# Patient Record
Sex: Male | Born: 1985 | Race: Black or African American | Hispanic: No | Marital: Single | Smoking: Current every day smoker
Health system: Southern US, Community
[De-identification: ages and names within clinical notes are randomized; demographics above are authoritative.]

---
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
## 2009-07-03 ENCOUNTER — Emergency Department: Payer: Self-pay | Admitting: Emergency Medicine

## 2009-10-04 ENCOUNTER — Emergency Department: Payer: Self-pay | Admitting: Unknown Physician Specialty

## 2009-12-03 ENCOUNTER — Emergency Department: Payer: Self-pay | Admitting: Emergency Medicine

## 2009-12-04 ENCOUNTER — Emergency Department: Payer: Self-pay | Admitting: Emergency Medicine

## 2009-12-14 ENCOUNTER — Emergency Department: Payer: Self-pay

## 2010-10-08 ENCOUNTER — Emergency Department: Payer: Self-pay | Admitting: Internal Medicine

## 2011-01-11 ENCOUNTER — Emergency Department: Payer: Self-pay | Admitting: Emergency Medicine

## 2011-01-15 ENCOUNTER — Emergency Department (HOSPITAL_COMMUNITY)
Admission: EM | Admit: 2011-01-15 | Discharge: 2011-01-15 | Discharge status: Against Medical Advice | Payer: Self-pay | Attending: Emergency Medicine | Admitting: Emergency Medicine

## 2011-01-15 DIAGNOSIS — Z0389 Encounter for observation for other suspected diseases and conditions ruled out: Secondary | ICD-10-CM | POA: Insufficient documentation

## 2011-06-06 IMAGING — CR RIGHT RING FINGER 2+V
1 series · 3 of 3 positions shown · non-contrast
Comparison: none

REASON FOR EXAM: injury possible dislocation
COMMENTS:

[Series 1: view not recorded · 0.17mm/px · 3 of 3 slices shown]
[im 1/3]
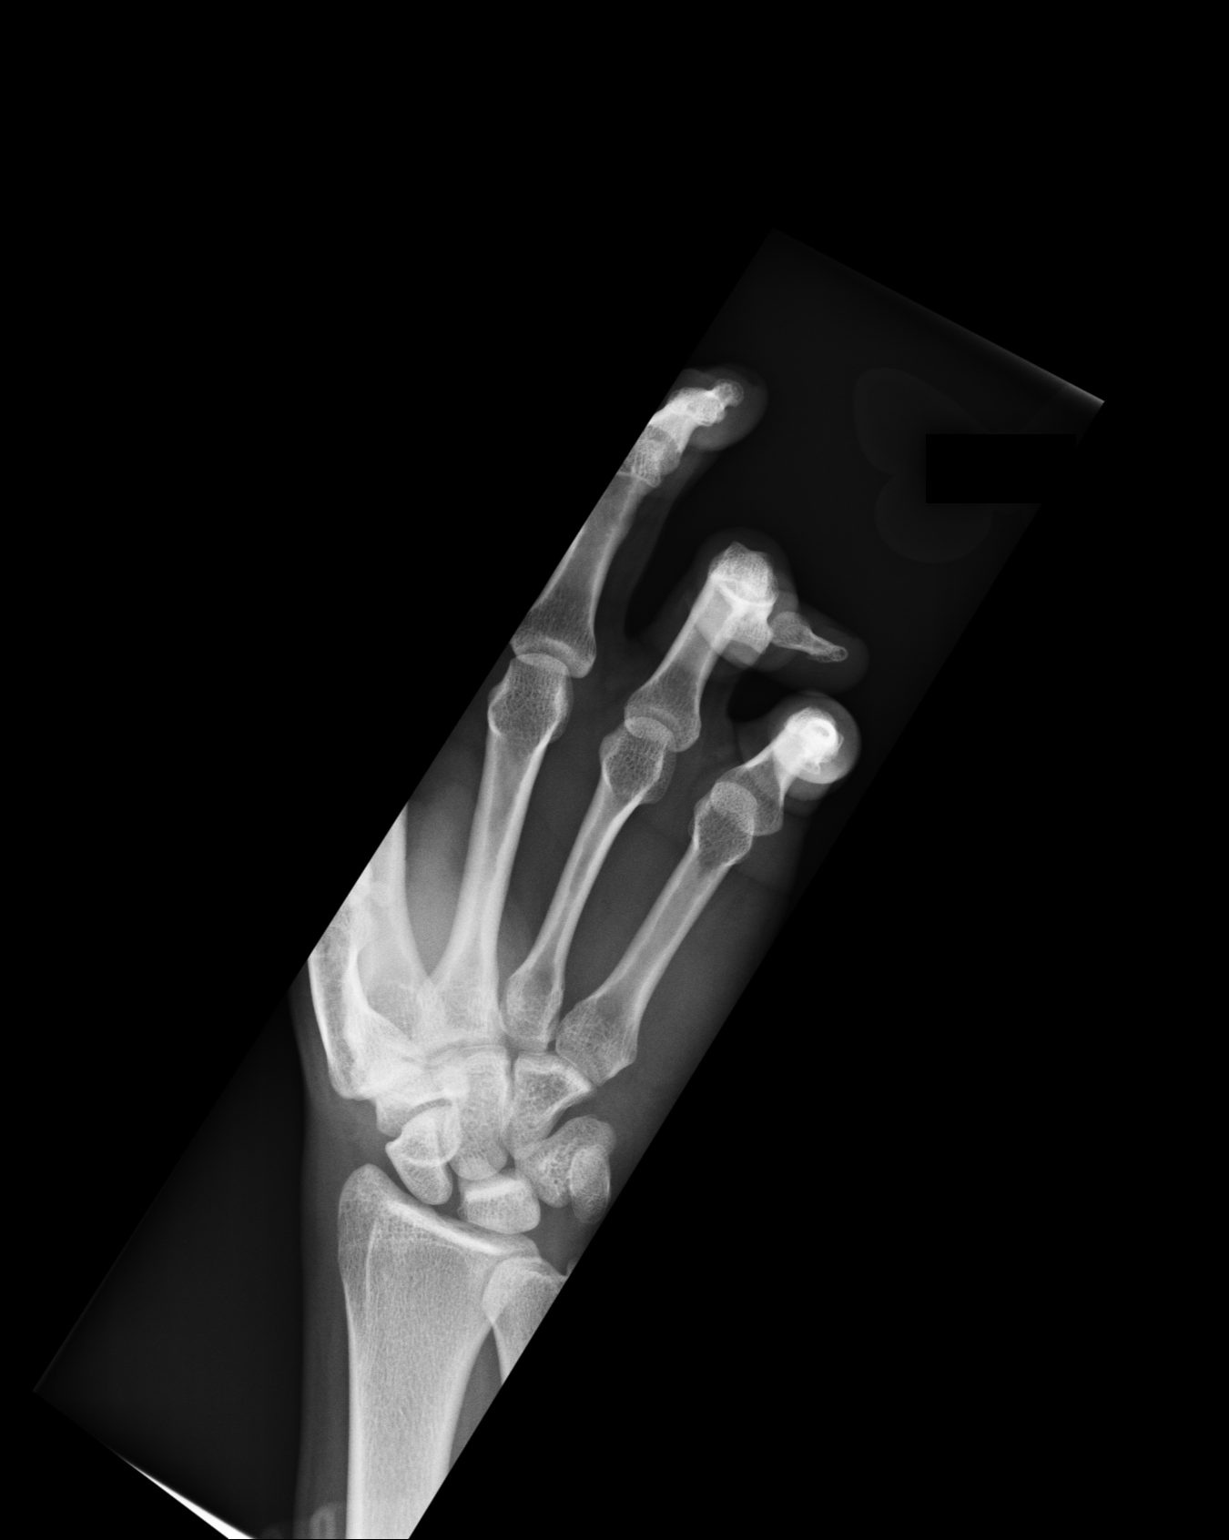
[im 2/3]
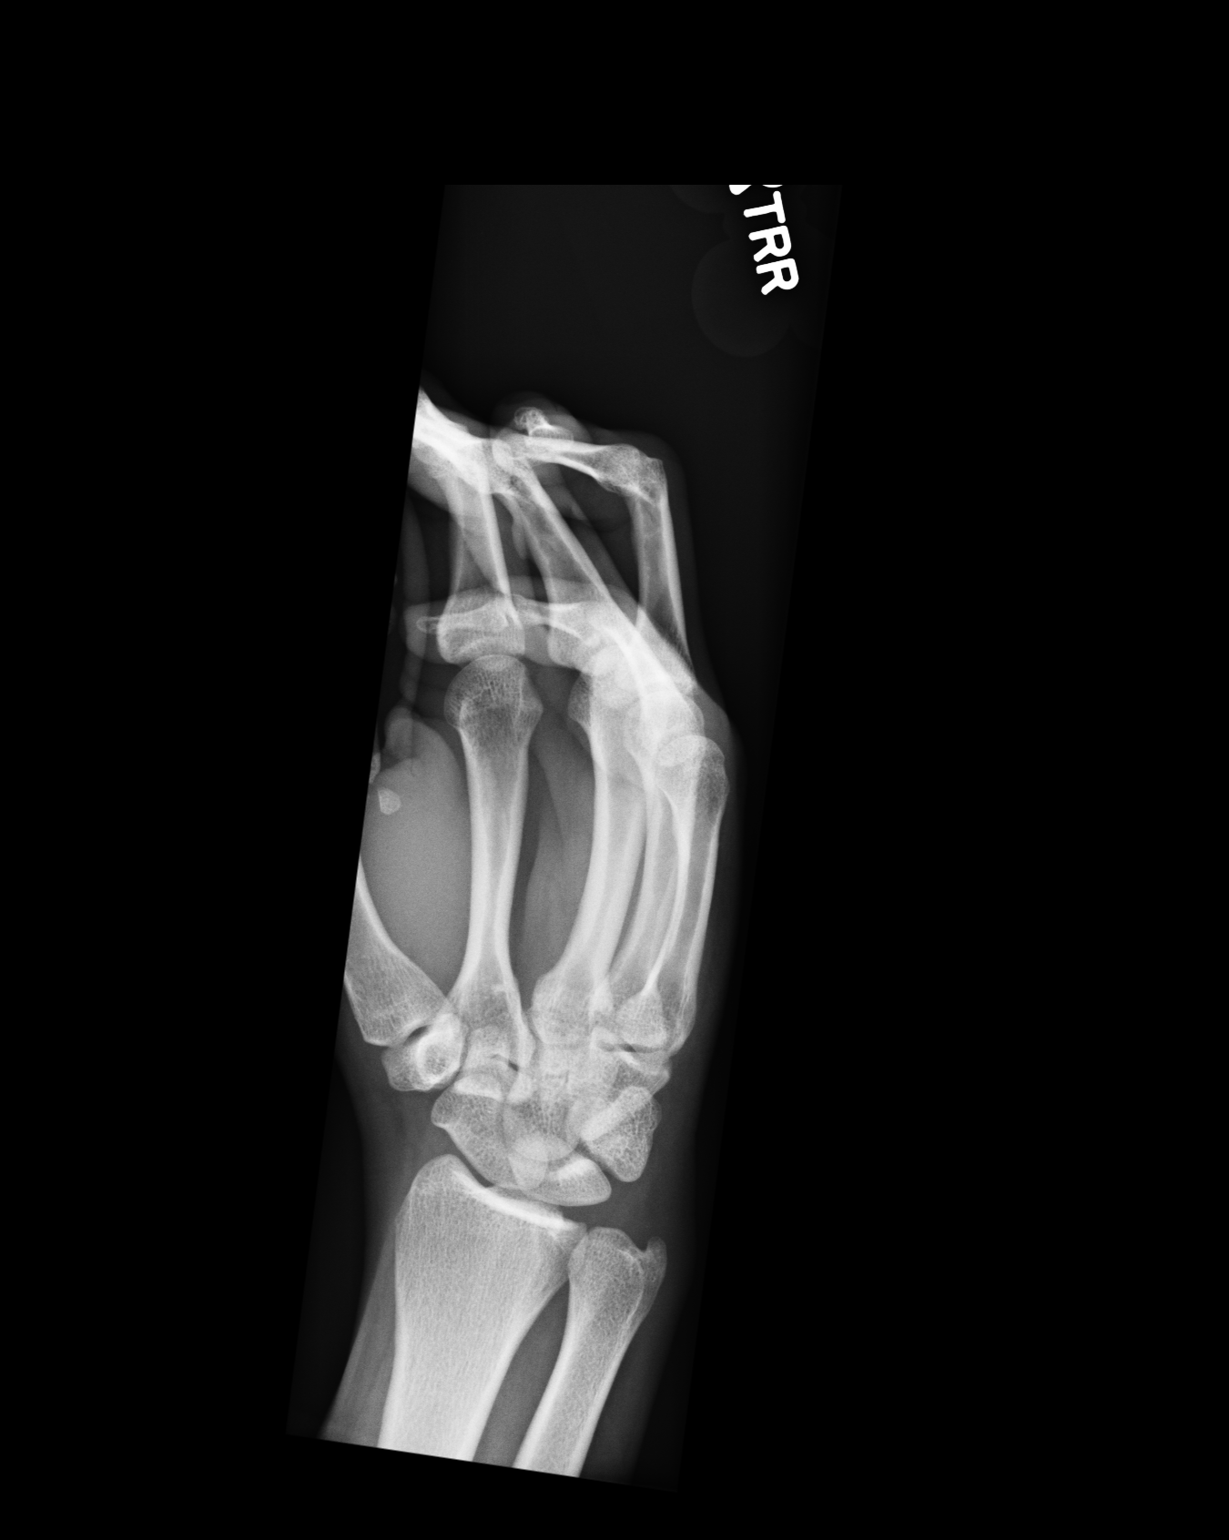
[im 3/3]
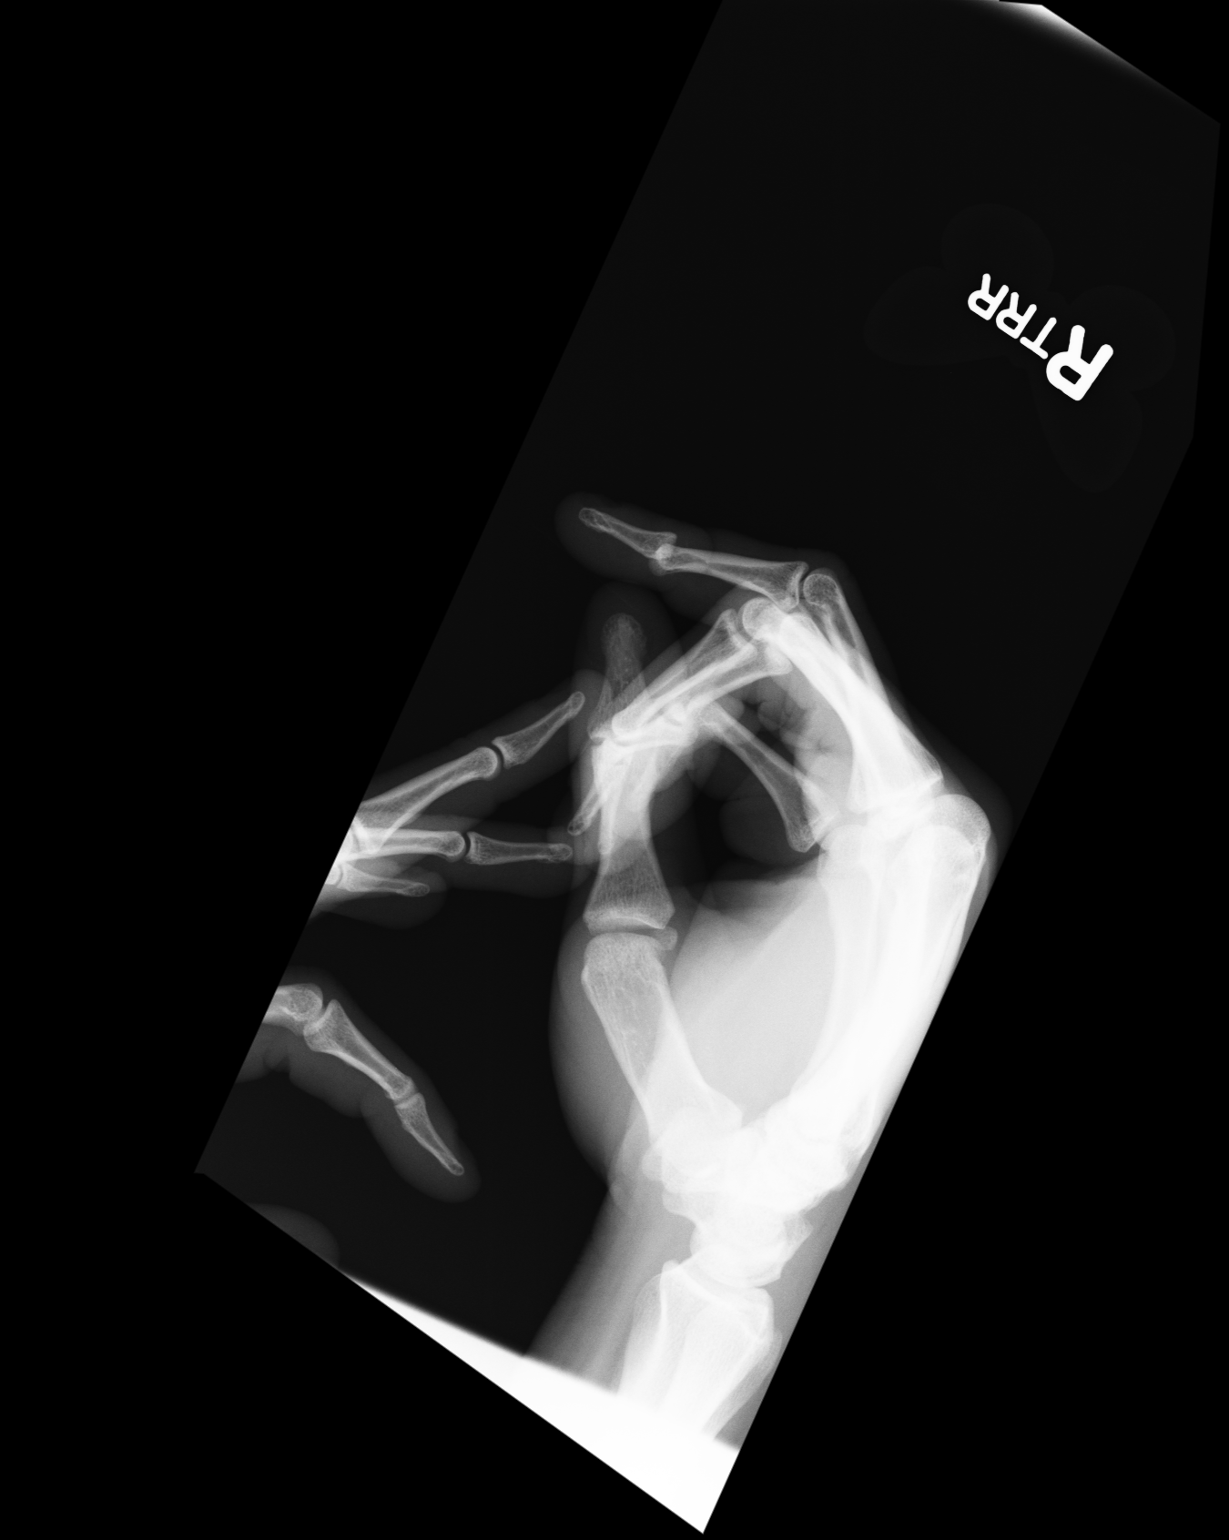

[3 of 3 positions shown; findings below may reference images not displayed]

PROCEDURE:     DXR - DXR FINGER RING 4TH DIGIT RT BETANCOURTH  - October 05, 2009 [DATE]

RESULT:     Images of the right fourth digit demonstrate what appears to be
dislocation at the distal interphalangeal joint in the fourth digit of the
right hand. There appears to be medial and dorsal displacement. It is
difficult to exclude a tiny fracture but no definite fracture is
demonstrated.
IMPRESSION: Please see above.

## 2012-03-10 ENCOUNTER — Emergency Department: Payer: Self-pay | Admitting: *Deleted

## 2012-03-10 LAB — BASIC METABOLIC PANEL
Anion Gap: 4 — ABNORMAL LOW (ref 7–16)
BUN: 20 mg/dL — ABNORMAL HIGH (ref 7–18)
Chloride: 105 mmol/L (ref 98–107)
Co2: 31 mmol/L (ref 21–32)
Creatinine: 1.43 mg/dL — ABNORMAL HIGH (ref 0.60–1.30)
EGFR (African American): >60
Osmolality: 280 (ref 275–301)
Sodium: 140 mmol/L (ref 136–145)

## 2012-03-11 ENCOUNTER — Emergency Department: Payer: Self-pay | Admitting: Emergency Medicine

## 2012-03-11 LAB — COMPREHENSIVE METABOLIC PANEL
Albumin: 4 g/dL (ref 3.4–5.0)
Anion Gap: 6 — ABNORMAL LOW (ref 7–16)
BUN: 20 mg/dL — ABNORMAL HIGH (ref 7–18)
Bilirubin,Total: 0.3 mg/dL (ref 0.2–1.0)
Chloride: 106 mmol/L (ref 98–107)
Co2: 30 mmol/L (ref 21–32)
EGFR (African American): >60
Glucose: 54 mg/dL — ABNORMAL LOW (ref 65–99)
Osmolality: 283 (ref 275–301)
Potassium: 3.8 mmol/L (ref 3.5–5.1)
SGOT(AST): 22 U/L (ref 15–37)
Sodium: 142 mmol/L (ref 136–145)
Total Protein: 7.2 g/dL (ref 6.4–8.2)

## 2012-03-11 LAB — URINALYSIS, COMPLETE
Bacteria: NONE SEEN
Blood: NEGATIVE
Ph: 6 (ref 4.5–8.0)
Protein: 30
RBC,UR: 1 /HPF (ref 0–5)
Specific Gravity: 1.026 (ref 1.003–1.030)
Squamous Epithelial: 1

## 2012-03-11 LAB — CBC
MCH: 33.3 pg (ref 26.0–34.0)
MCHC: 35.4 g/dL (ref 32.0–36.0)
MCV: 94 fL (ref 80–100)
Platelet: 149 10*3/uL — ABNORMAL LOW (ref 150–440)
RDW: 13.2 % (ref 11.5–14.5)
WBC: 6.3 10*3/uL (ref 3.8–10.6)

## 2012-09-12 IMAGING — CR DG LUMBAR SPINE 2-3V
1 series · 3 of 3 positions shown · non-contrast
Comparison: none

REASON FOR EXAM: low back pain
COMMENTS:

PROCEDURE:     DXR - DXR LUMBAR SPINE AP AND LATERAL  - January 11, 2011 [DATE]
RESULT:     Five non-rib bearing lumbar vertebral bodies are appreciated.
There is no evidence of fracture, dislocation or malalignment.

[Series 1: view not recorded · 0.17mm/px · 3 of 3 slices shown]
[im 1/3]
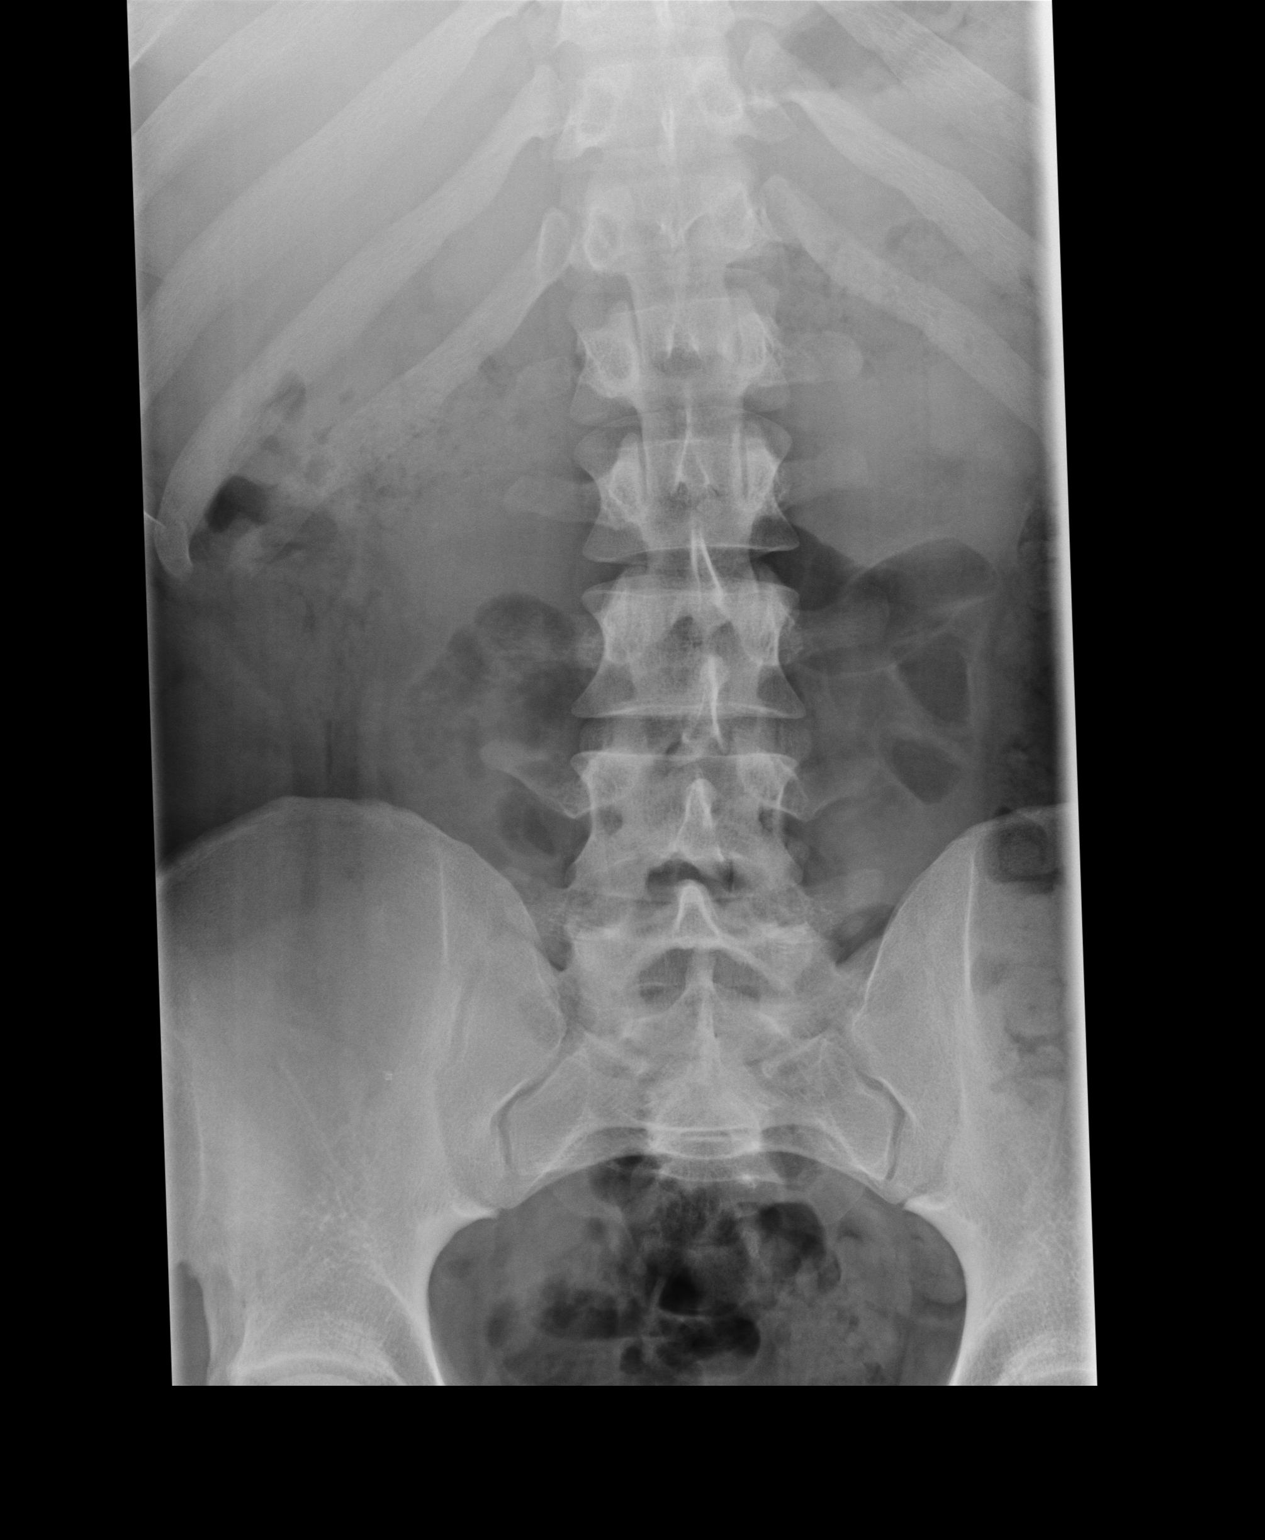
[im 2/3]
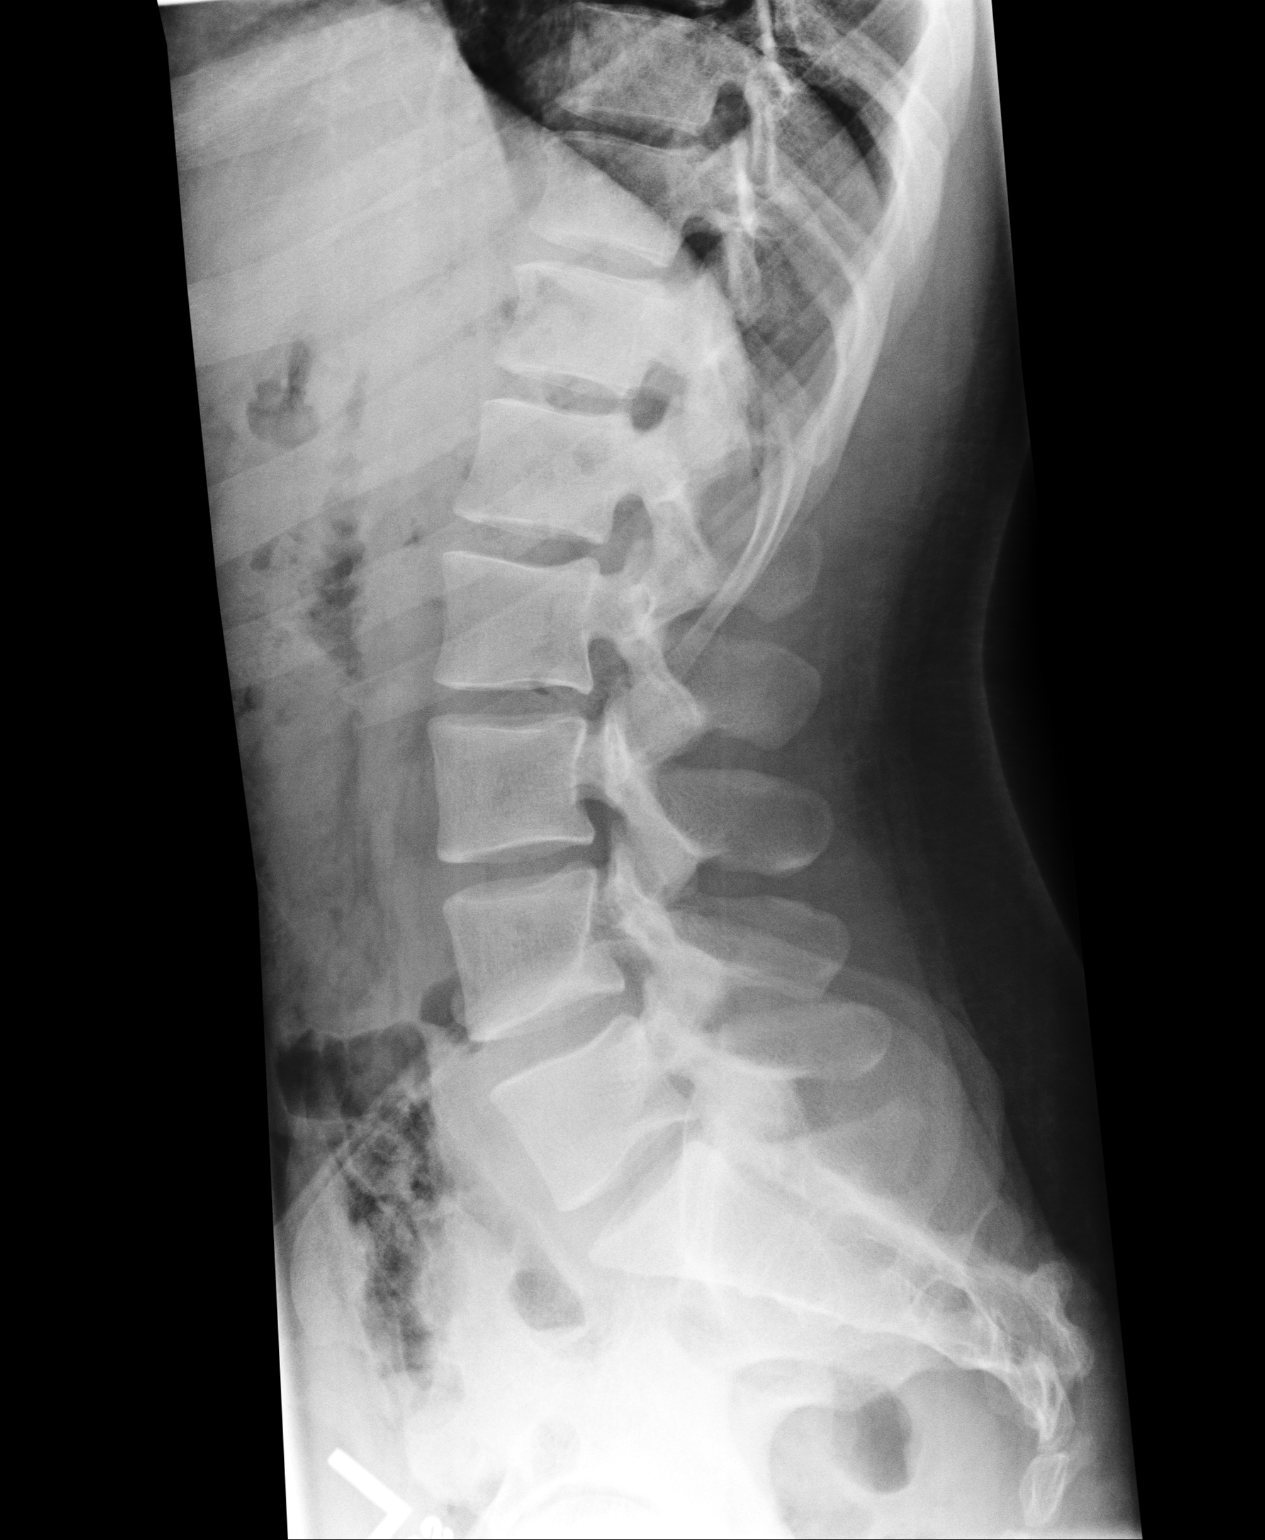
[im 3/3]
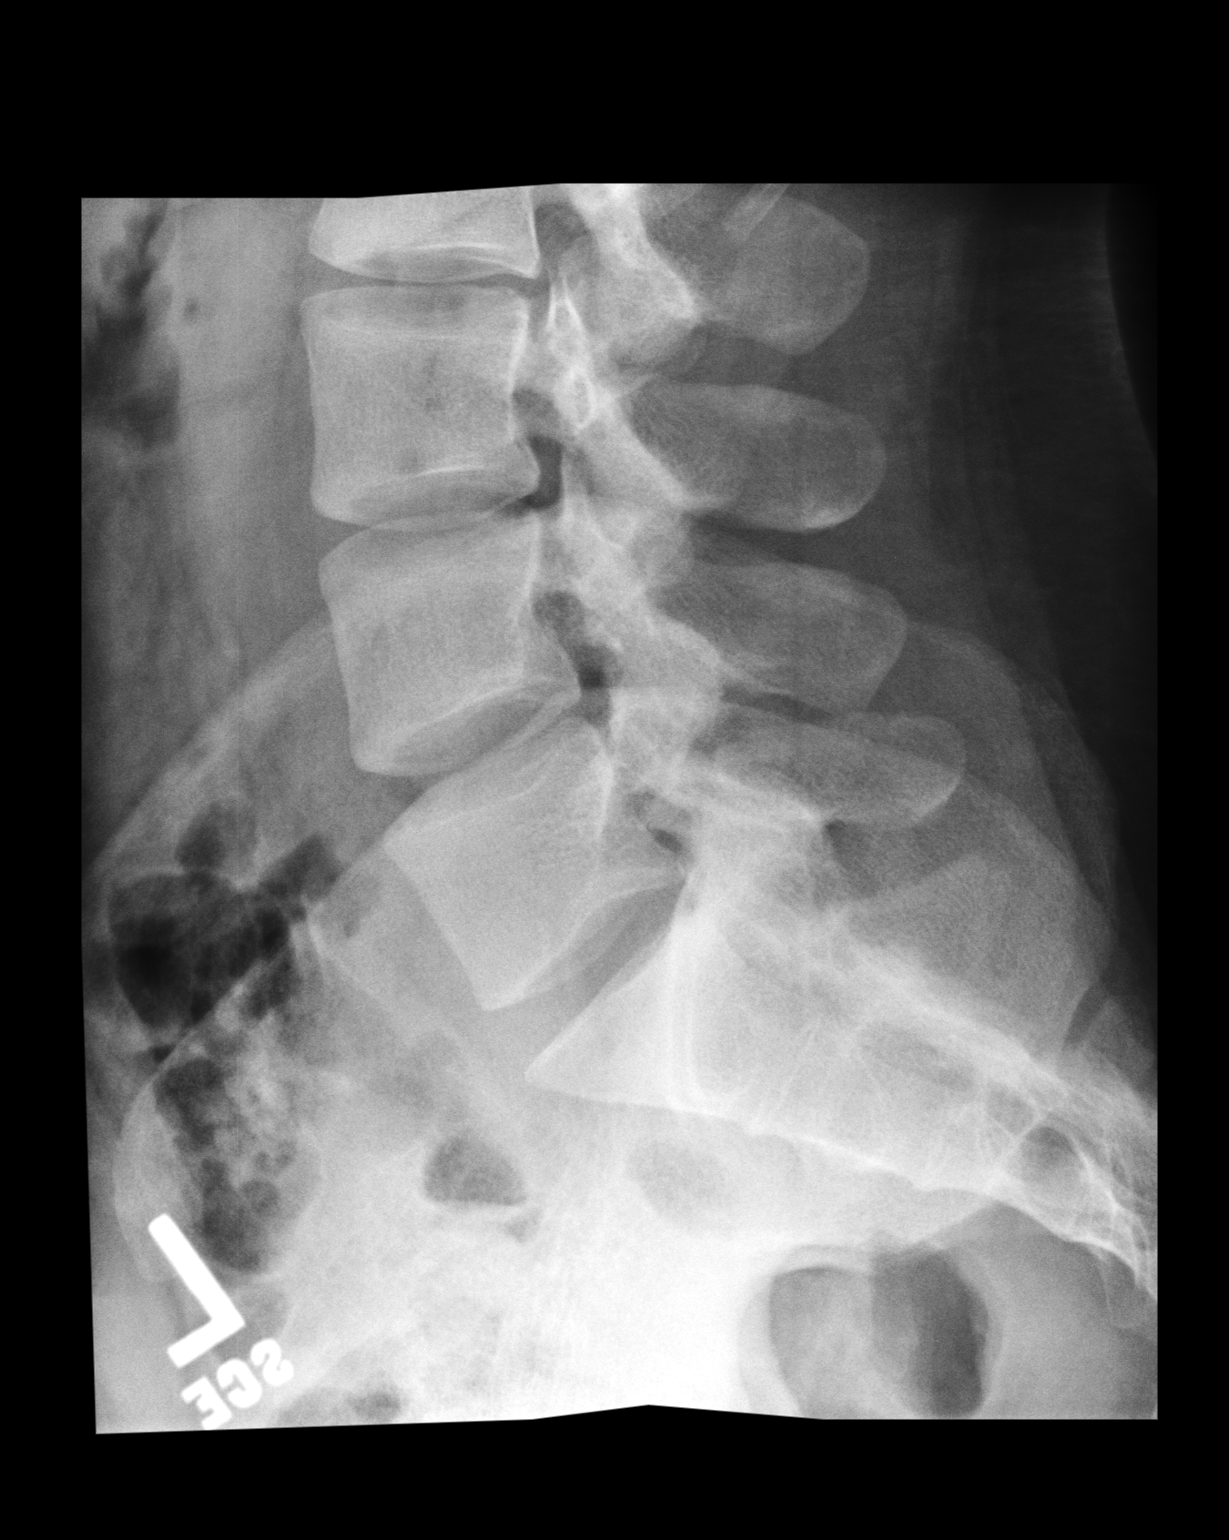

[3 of 3 positions shown; findings below may reference images not displayed]

IMPRESSION: 1. No acute osseous abnormalities.
2. If there is persistent clinical concern or persistent complaints of pain,
further evaluation with MRI is recommended.

## 2014-01-26 ENCOUNTER — Emergency Department: Payer: Self-pay | Admitting: Student

## 2015-02-11 ENCOUNTER — Encounter: Payer: Self-pay | Admitting: Emergency Medicine

## 2015-02-11 ENCOUNTER — Emergency Department
Admission: EM | Admit: 2015-02-11 | Discharge: 2015-02-11 | Disposition: A | Discharge status: Home / Self Care | Payer: Self-pay | Attending: Emergency Medicine | Admitting: Emergency Medicine

## 2015-02-11 DIAGNOSIS — Z720 Tobacco use: Secondary | ICD-10-CM | POA: Insufficient documentation

## 2015-02-11 DIAGNOSIS — B360 Pityriasis versicolor: Secondary | ICD-10-CM | POA: Insufficient documentation

## 2015-02-11 DIAGNOSIS — Z880 Allergy status to penicillin: Secondary | ICD-10-CM | POA: Insufficient documentation

## 2015-02-11 DIAGNOSIS — A46 Erysipelas: Secondary | ICD-10-CM | POA: Insufficient documentation

## 2015-02-11 LAB — CBC WITH DIFFERENTIAL/PLATELET
BASOS PCT: 1 %
Basophils Absolute: 0 10*3/uL (ref 0–0.1)
EOS ABS: 0.2 10*3/uL (ref 0–0.7)
EOS PCT: 3 %
HCT: 42.6 % (ref 40.0–52.0)
Hemoglobin: 14.6 g/dL (ref 13.0–18.0)
LYMPHS PCT: 24 %
Lymphs Abs: 1.9 10*3/uL (ref 1.0–3.6)
MCH: 32.2 pg (ref 26.0–34.0)
MCHC: 34.2 g/dL (ref 32.0–36.0)
MCV: 94.1 fL (ref 80.0–100.0)
Monocytes Absolute: 0.6 10*3/uL (ref 0.2–1.0)
Monocytes Relative: 7 %
Neutro Abs: 5.1 10*3/uL (ref 1.4–6.5)
Neutrophils Relative %: 65 %
PLATELETS: 167 10*3/uL (ref 150–440)
RBC: 4.53 MIL/uL (ref 4.40–5.90)
RDW: 12.7 % (ref 11.5–14.5)
WBC: 7.9 10*3/uL (ref 3.8–10.6)

## 2015-02-11 LAB — BASIC METABOLIC PANEL
ANION GAP: 7 (ref 5–15)
BUN: 15 mg/dL (ref 6–20)
CO2: 28 mmol/L (ref 22–32)
Calcium: 9 mg/dL (ref 8.9–10.3)
Chloride: 103 mmol/L (ref 101–111)
Creatinine, Ser: 1.19 mg/dL (ref 0.61–1.24)
GFR calc Af Amer: >60 mL/min (ref >60)
Glucose, Bld: 85 mg/dL (ref 65–99)
Potassium: 4 mmol/L (ref 3.5–5.1)
SODIUM: 138 mmol/L (ref 135–145)

## 2015-02-11 MED ORDER — CLINDAMYCIN PHOSPHATE 600 MG/50ML IV SOLN
600.0000 mg | Freq: Once | INTRAVENOUS | Status: AC
Start: 2015-02-11 — End: 2015-02-11
  Administered 2015-02-11: 600 mg via INTRAVENOUS
  Filled 2015-02-11: qty 50

## 2015-02-11 MED ORDER — CLINDAMYCIN HCL 300 MG PO CAPS: 300.0000 mg | ORAL_CAPSULE | Freq: Four times a day (QID) | ORAL | Status: DC

## 2015-02-11 NOTE — Discharge Instructions (Signed)
Erysipelas Erysipelas is a sudden form of cellulitis (inflammation of the cells) that affects the tissues near the skin surface. It is most often caused by a streptococcal or staphylococcal (germ) infection. SYMPTOMS Erysipelas begins as just not feeling well (malaise), chills, and a fever of usually 101 F (38.3 C) to 104 F (40 C). Being it is an inflammation (soreness) of the skin and the tissue just beneath the skin; it shows up as a reddened area with sharp borders. It may be streaked because the lymphatics are infected. These are lymph channels that flow out of your glands (lymph nodes), like the glands in your neck. The reddened area may be tender to touch with itching and burning of the skin. Sometimes this is accompanied by feelings of nausea (you are sick to your stomach) and vomiting (throwing up). Sometimes there may be a break in the skin over the reddened area which is where the bacteria (germs) entered the body. Sometimes there may not appear to be a site of entry. The most common area for erysipelas to appear is on the lower legs. When the legs are infected, it is usually the glands (lymph nodes) in the groin that may be enlarged and tender. DIAGNOSIS  Your caregiver most often bases the diagnosis (learning what is wrong) on your physical findings (examination). It is often hard to grow the germs that produce this illness. Sometimes blood cultures (to see what germs may be growing in your blood) will be done if there is a high fever and the blood cultures are likely to be positive. This means the culture is able to grow the bacteria (germ) producing the erysipelas. If blood counts are done, the white blood count is usually elevated. The ESR (erythrocyte sedimentation rate) is also usually elevated (higher than normal). The ESR is just a nonspecific sign of infection being present. TREATMENT  This infection usually responds rapidly to medications which kill germs (antibiotics). Depending on  findings and course of the illness (gets better or worse), your caregiver will be able to decide which is the best possible treatment for you. Most often these infections respond well to penicillin in individuals not allergic to penicillin. Other alternatives are available for those who cannot take penicillin. HOME CARE INSTRUCTIONS   You may return to work as directed.  Only take over-the-counter or prescription medicines for pain, discomfort, or fever as directed by your caregiver.  Finish all antibiotics as prescribed by your caregiver even if it looks as if the infection has cleared completely. SEEK MEDICAL CARE IF:   Your chills and feelings of illness are getting worse.  You have pain or discomfort not controlled by medications, or if symptoms seem to be getting worse rather than better.  The reddened area of infection seems to be spreading rather than getting smaller, red lines are extending away from the infection toward your chest or abdomen, or a part of the infection begins to turn dark in color.  The problem returns in the same or another area after it seems to have gone away. MAKE SURE YOU:   Understand these instructions.  Will watch your condition.  Will get help right away if you are not doing well or get worse. Document Released: 03/05/2001 Document Revised: 09/02/2011 Document Reviewed: 01/27/2008 [facility] Patient Information 2015 [place], [place]. This information is not intended to replace advice given to you by your health care provider. Make sure you discuss any questions you have with your health care provider.  Tinea Versicolor  Tinea versicolor is a common yeast infection of the skin. This condition becomes known when the yeast on our skin starts to overgrow (yeast is a normal inhabitant on our skin). This condition is noticed as white or light brown patches on brown skin, and is more evident in the summer on tanned skin. These areas are slightly scaly if scratched.  The light patches from the yeast become evident when the yeast creates "holes in your suntan". This is most often noticed in the summer. The patches are usually located on the chest, back, pubis, neck and body folds. However, it may occur on any area of body. Mild itching and inflammation (redness or soreness) may be present. DIAGNOSIS  The diagnosisof this is made clinically (by looking). Cultures from samples are usually not needed. Examination under the microscope may help. However, yeast is normally found on skin. The diagnosis still remains clinical. Examination under Wood's Ultraviolet Light can determine the extent of the infection. TREATMENT  This common infection is usually only of cosmetic (only a concern to your appearance). It is easily treated with dandruff shampoo used during showers or bathing. Vigorous scrubbing will eliminate the yeast over several days time. The light areas in your skin may remain for weeks or months after the infection is cured unless your skin is exposed to sunlight. The lighter or darker spots caused by the fungus that remain after complete treatment are not a sign of treatment failure; it will take a long time to resolve. Your caregiver may recommend a number of commercial preparations or medication by mouth if home care is not working. Recurrence is common and preventative medication may be necessary. This skin condition is not highly contagious. Special care is not needed to protect close friends and family members. Normal hygiene is usually enough. Follow up is required only if you develop complications (such as a secondary infection from scratching), if recommended by your caregiver, or if no relief is obtained from the preparations used. Document Released: 06/07/2000 Document Revised: 09/02/2011 Document Reviewed: 07/20/2008 [facility] Patient Information 2015 [place], [place]. This information is not intended to replace advice given to you by your health care  provider. Make sure you discuss any questions you have with your health care provider.  Take the prescription meds as directed. Return as needed for wound check. Rest with the leg elevated when seated to promote healing.

## 2015-02-11 NOTE — ED Notes (Signed)
States approx 1 month ago saw red raised area L lower leg, thought he had a spider bite, since continues red shin area and foot and ankle swollen

## 2015-02-11 NOTE — ED Provider Notes (Signed)
[facility] Emergency Department Provider Note ____________________________________________  Time seen: 1100  I have reviewed the triage vital signs and the nursing notes.  HISTORY  Chief Complaint  Insect Bite  HPI William Mendoza is a 29 y.o. male reports to the ED for treatment of an area to his left lower leg continues to have increased redness, tenderness and swelling.He is not short initial cause, but reports that about a month ago he had a small scab to the shin area that he thought was an insect bite. He scraped off the scab and cleaning the area with peroxide, alcohol, and chlorine bleach. Since that time he's noted the skin has healed, but the area around that initial sore has become hot, and tender. He denies any fevers, chills, sweats, or nausea.  History reviewed. No pertinent past medical history.  There are no active problems to display for this patient.  History reviewed. No pertinent past surgical history.  Current Outpatient Rx  Name  Route  Sig  Dispense  Refill  . clindamycin (CLEOCIN) 300 MG capsule   Oral   Take 1 capsule (300 mg total) by mouth 4 (four) times daily.   40 capsule   0    Allergies Penicillins  No family history on file.  Social History Social History  Substance Use Topics  . Smoking status: Current Every Day Smoker -- 0.10 packs/day    Types: Cigarettes  . Smokeless tobacco: None  . Alcohol Use: Yes   Review of Systems  Constitutional: Negative for fever. Eyes: Negative for visual changes. ENT: Negative for sore throat. Cardiovascular: Negative for chest pain. Respiratory: Negative for shortness of breath. Gastrointestinal: Negative for abdominal pain, vomiting and diarrhea. Genitourinary: Negative for dysuria. Musculoskeletal: Negative for back pain. Skin: Negative for rash. LLE skin redness, tenderness, and warmth as above. Neurological: Negative for headaches, focal weakness or  numbness. ____________________________________________  PHYSICAL EXAM:  VITAL SIGNS: ED Triage Vitals  Enc Vitals Group     BP 02/11/15 1045 120/68 mmHg     Pulse Rate 02/11/15 1045 84     Resp 02/11/15 1045 16     Temp 02/11/15 1045 98.3 F (36.8 C)     Temp Source 02/11/15 1045 Oral     SpO2 02/11/15 1045 99 %     Weight 02/11/15 1045 215 lb (97.523 kg)     Height 02/11/15 1045  (1.854 m)     Head Cir --      Peak Flow --      Pain Score 02/11/15 1051 10     Pain Loc --      Pain Edu? --      Excl. in GC? --    Constitutional: Alert and oriented. Well appearing and in no distress. Eyes: Conjunctivae are normal. PERRL. Normal extraocular movements. ENT   Head: Normocephalic and atraumatic.   Nose: No congestion/rhinnorhea.   Mouth/Throat: Mucous membranes are moist.   Neck: Supple. No thyromegaly. Hematological/Lymphatic/Immunilogical: No cervical lymphadenopathy. Cardiovascular: Normal rate, regular rhythm. Normal distal pulses. Respiratory: Normal respiratory effort. No wheezes/rales/rhonchi. Gastrointestinal: Soft and nontender. No distention. Musculoskeletal: Nontender with normal range of motion in all extremities.  Neurologic:  Normal gait without ataxia. Normal speech and language. No gross focal neurologic deficits are appreciated. Skin:  Skin is warm, dry and intact. No rash noted. No lesion noted. Local, well-demarcated area of superficial erythema, warmth, and edema to the left shin. No fluctuance, punctum, or focal abscess formation noted.  Psychiatric: Mood  and affect are normal. Patient exhibits appropriate insight and judgment. ____________________________________________   LABS (pertinent positives/negatives) Labs Reviewed  CULTURE, BLOOD (ROUTINE X 2)  CULTURE, BLOOD (ROUTINE X 2)  CBC WITH DIFFERENTIAL/PLATELET  BASIC METABOLIC PANEL  ____________________________________________  PROCEDURES  Clindamycin 600 mg  IVP ____________________________________________  INITIAL IMPRESSION / ASSESSMENT AND PLAN / ED COURSE  LLE erysipelas x 1 month. Treatment with Clindamycin #40. Wound care instructions provided.  ____________________________________________  FINAL CLINICAL IMPRESSION(S) / ED DIAGNOSES  Final diagnoses:  Erysipelas of lower extremity  Tinea versicolor     Lissa Hoard, PA-C 02/11/15 1227  Darien Ramus, MD 02/11/15 1539

## 2015-02-15 ENCOUNTER — Emergency Department (HOSPITAL_COMMUNITY): Payer: Self-pay

## 2015-02-15 ENCOUNTER — Emergency Department (HOSPITAL_COMMUNITY)
Admission: EM | Admit: 2015-02-15 | Discharge: 2015-02-15 | Disposition: A | Discharge status: Home / Self Care | Payer: Self-pay | Attending: Emergency Medicine | Admitting: Emergency Medicine

## 2015-02-15 ENCOUNTER — Emergency Department (HOSPITAL_BASED_OUTPATIENT_CLINIC_OR_DEPARTMENT_OTHER)
Admission: EM | Admit: 2015-02-15 | Discharge: 2015-02-15 | Disposition: A | Discharge status: Home / Self Care | Payer: Self-pay | Source: Home / Self Care

## 2015-02-15 DIAGNOSIS — R609 Edema, unspecified: Secondary | ICD-10-CM

## 2015-02-15 DIAGNOSIS — R600 Localized edema: Secondary | ICD-10-CM | POA: Insufficient documentation

## 2015-02-15 DIAGNOSIS — M79609 Pain in unspecified limb: Secondary | ICD-10-CM

## 2015-02-15 LAB — BASIC METABOLIC PANEL
ANION GAP: 11 (ref 5–15)
BUN: 17 mg/dL (ref 6–20)
CALCIUM: 9.5 mg/dL (ref 8.9–10.3)
CO2: 27 mmol/L (ref 22–32)
CREATININE: 1.3 mg/dL — AB (ref 0.61–1.24)
Chloride: 100 mmol/L — ABNORMAL LOW (ref 101–111)
Glucose, Bld: 82 mg/dL (ref 65–99)
Potassium: 4.2 mmol/L (ref 3.5–5.1)
SODIUM: 138 mmol/L (ref 135–145)

## 2015-02-15 LAB — CBC WITH DIFFERENTIAL/PLATELET
BASOS ABS: 0 10*3/uL (ref 0.0–0.1)
BASOS PCT: 0 % (ref 0–1)
EOS ABS: 0.2 10*3/uL (ref 0.0–0.7)
Eosinophils Relative: 3 % (ref 0–5)
HEMATOCRIT: 42.9 % (ref 39.0–52.0)
Hemoglobin: 15.4 g/dL (ref 13.0–17.0)
Lymphocytes Relative: 24 % (ref 12–46)
Lymphs Abs: 2 10*3/uL (ref 0.7–4.0)
MCH: 33 pg (ref 26.0–34.0)
MCHC: 35.9 g/dL (ref 30.0–36.0)
MCV: 92.1 fL (ref 78.0–100.0)
MONO ABS: 0.6 10*3/uL (ref 0.1–1.0)
Monocytes Relative: 7 % (ref 3–12)
NEUTROS ABS: 5.6 10*3/uL (ref 1.7–7.7)
NEUTROS PCT: 66 % (ref 43–77)
Platelets: 210 10*3/uL (ref 150–400)
RBC: 4.66 MIL/uL (ref 4.22–5.81)
RDW: 12.3 % (ref 11.5–15.5)
WBC: 8.4 10*3/uL (ref 4.0–10.5)

## 2015-02-15 MED ORDER — TRAMADOL HCL 50 MG PO TABS: 50.0000 mg | ORAL_TABLET | Freq: Four times a day (QID) | ORAL | Status: DC | PRN

## 2015-02-15 MED ORDER — FUROSEMIDE 20 MG PO TABS
20.0000 mg | ORAL_TABLET | Freq: Once | ORAL | Status: AC
  Administered 2015-02-15: 20 mg via ORAL
  Filled 2015-02-15: qty 1

## 2015-02-15 MED ORDER — IBUPROFEN 800 MG PO TABS: 800.0000 mg | ORAL_TABLET | Freq: Three times a day (TID) | ORAL | Status: DC

## 2015-02-15 NOTE — ED Notes (Signed)
Pt triage completed during downtime.

## 2015-02-15 NOTE — ED Provider Notes (Addendum)
CSN: [id number]     Arrival date & time 02/15/15  1601 History   First MD Initiated Contact with Patient 02/15/15 1729     No chief complaint on file.    (Consider location/radiation/quality/duration/timing/severity/associated sxs/prior Treatment) HPI Comments: Patient presents to the emergency department for evaluation of pain and swelling of the left leg. Patient reports that symptoms began approximately 2 months ago. He does report that he was seen in another ER previously. He had blood work performed and was told he had cellulitis. He reports that he was told that he needed an ultrasound to rule out blood clot, but that never got scheduled. He reports that the pain and swelling has progressively worsened. Denies any direct trauma to the area. Has not ever been any open wounds on the leg. He does not have any chest pain or shortness of breath.   No past medical history on file. No past surgical history on file. No family history on file. Social History  Substance Use Topics  . Smoking status: Not on file  . Smokeless tobacco: Not on file  . Alcohol Use: Not on file    Review of Systems  Skin: Negative for wound.  All other systems reviewed and are negative.     Allergies  Review of patient's allergies indicates not on file.  Home Medications   Prior to Admission medications   Not on File   BP 117/78 mmHg  Pulse 86  SpO2 99% Physical Exam  Constitutional: He is oriented to person, place, and time. He appears well-developed and well-nourished. No distress.  HENT:  Head: Normocephalic and atraumatic.  Right Ear: Hearing normal.  Left Ear: Hearing normal.  Nose: Nose normal.  Mouth/Throat: Oropharynx is clear and moist and mucous membranes are normal.  Eyes: Conjunctivae and EOM are normal. Pupils are equal, round, and reactive to light.  Neck: Normal range of motion. Neck supple.  Cardiovascular: Regular rhythm, S1 normal and S2 normal.  Exam reveals no gallop and no  friction rub.   No murmur heard. Pulses:      Dorsalis pedis pulses are 2+ on the left side.  Pulmonary/Chest: Effort normal and breath sounds normal. No respiratory distress. He exhibits no tenderness.  Abdominal: Soft. Normal appearance and bowel sounds are normal. There is no hepatosplenomegaly. There is no tenderness. There is no rebound, no guarding, no tenderness at McBurney's point and negative Murphy's sign. No hernia.  Musculoskeletal: Normal range of motion.       Left lower leg: He exhibits tenderness and swelling.  Neurological: He is alert and oriented to person, place, and time. He has normal strength. No cranial nerve deficit or sensory deficit. Coordination normal. GCS eye subscore is 4. GCS verbal subscore is 5. GCS motor subscore is 6.  Skin: Skin is warm, dry and intact. No rash noted. No cyanosis.  Psychiatric: He has a normal mood and affect. His speech is normal and behavior is normal. Thought content normal.  Nursing note and vitals reviewed.   ED Course  Procedures (including critical care time) Labs Review Labs Reviewed  BASIC METABOLIC PANEL - Abnormal; Notable for the following:    Chloride 100 (*)    Creatinine, Ser 1.30 (*)    All other components within normal limits  CBC WITH DIFFERENTIAL/PLATELET    Imaging Review Dg Tibia/fibula Left  02/15/2015   CLINICAL DATA:  Pain and swelling  EXAM: LEFT TIBIA AND FIBULA - 2 VIEW  COMPARISON:  None.  FINDINGS: There is  diffuse soft tissue swelling about the ankle. No underlying bone abnormality identified. No osseous erosion or fracture identified. No radiopaque foreign bodies are identified.  IMPRESSION: 1. Diffuse soft tissue swelling about the ankle. 2. No acute bone abnormality.   Electronically Signed   By: Signa Kell M.D.   On: 02/15/2015 18:09   I have personally reviewed and evaluated these images and lab results as part of my medical decision-making.   EKG Interpretation None      MDM   Final  diagnoses:  None  peripheral edema   Patient presents to the ER for evaluation of pain and swelling of his left leg. Patient reports that symptoms started approximately 2 months ago. He initially thought he had a spider bite. He did present to an outside ER and was told he might have cellulitis. He was given IV antibiotic and a prescription for antibiotic discharge, but did not fill it. Examination today does not support diagnosis of cellulitis or abscess. He has diffuse nonpitting edema of the left leg with diffuse tenderness. No overlying skin changes, erythema or warmth. X-ray does not show any acute abnormality other than the swelling. Venous duplex shows no evidence of DVT. There is a nonvascular density noted of unclear etiology. Patient reports that he had surgery in the seventh grade, and there is an overlying scar. I do not palpate any discrete nodules or masses that could be biopsied. Compartments are soft. Distal pulses are bounding. Patient will be given a dose of Lasix for the edema, but will not be placed on continuously. He will have the leg wrapped and will elevate the area, establish follow-up with primary care.    Gilda Crease, MD 02/15/15 1942  Gilda Crease, MD 02/15/15 1944

## 2015-02-15 NOTE — Progress Notes (Signed)
*  Preliminary Results* Left lower extremity venous duplex completed. Left lower extremity is negative for deep vein thrombosis. There is no evidence of left Baker's cyst. There is an avascular heterogenous area visualized in the left lateral calf, the patient's area of concern; etiology unknown.   02/15/2015 6:33 PM  Gertie Fey, RVT, RDCS, RDMS

## 2015-02-15 NOTE — Discharge Instructions (Signed)

## 2015-02-15 NOTE — ED Notes (Signed)
Patient transported to X-ray and will then go to vascular.

## 2015-02-15 NOTE — ED Notes (Signed)
Vascular called and states pt is neg for DVT and baker's cyst. They state they looked at the raised area on the calf and say it looks "weird". Joni Reining, [place] informed.

## 2015-02-16 LAB — CULTURE, BLOOD (ROUTINE X 2)
CULTURE: NO GROWTH
Culture: NO GROWTH

## 2016-03-28 ENCOUNTER — Encounter: Payer: Self-pay | Admitting: Emergency Medicine

## 2016-03-28 ENCOUNTER — Emergency Department
Admission: EM | Admit: 2016-03-28 | Discharge: 2016-03-28 | Disposition: A | Discharge status: Home / Self Care | Payer: Self-pay | Attending: Emergency Medicine | Admitting: Emergency Medicine

## 2016-03-28 DIAGNOSIS — K640 First degree hemorrhoids: Secondary | ICD-10-CM | POA: Insufficient documentation

## 2016-03-28 DIAGNOSIS — F17210 Nicotine dependence, cigarettes, uncomplicated: Secondary | ICD-10-CM | POA: Insufficient documentation

## 2016-03-28 MED ORDER — HYDROCORTISONE 2.5 % RE CREA: TOPICAL_CREAM | RECTAL | 1 refills | Status: AC

## 2016-03-28 MED ORDER — OXYCODONE-ACETAMINOPHEN 5-325 MG PO TABS: 1.0000 | ORAL_TABLET | ORAL | 0 refills | Status: DC | PRN

## 2016-03-28 NOTE — ED Triage Notes (Signed)
C/O straining to move bowels last Thursday.  Since then, c/o a hemorrhoid.  Has been using OTC hemorrhoid cream with minimal effect, initially pain decreased but pain worsened again today.

## 2016-03-28 NOTE — ED Provider Notes (Signed)
[facility] Emergency Department Provider Note   ____________________________________________   None    (approximate)  I have reviewed the triage vital signs and the nursing notes.   HISTORY  Chief Complaint Hemorrhoids    HPI William Mendoza is a 30 y.o. male presents for evaluation of hemorrhoids 1 week. Patient states that originally was getting better with Preparation H but got worse again today. Admits to doing straining to go to the bathroom and doing some heavy lifting. Past medical history of hemorrhoids. Patient describes his pain as 9/10 at this time.   History reviewed. No pertinent past medical history.  There are no active problems to display for this patient.   History reviewed. No pertinent surgical history.  Prior to Admission medications   Medication Sig Start Date End Date Taking? Authorizing Provider  hydrocortisone (ANUSOL-HC) 2.5 % rectal cream Apply rectally 2 times daily 03/28/16 03/28/17  Charmayne Sheer Beers, PA-C  oxyCODONE-acetaminophen (ROXICET) 5-325 MG tablet Take 1-2 tablets by mouth every 4 (four) hours as needed for severe pain. 03/28/16   Evangeline Dakin, PA-C    Allergies Penicillins  No family history on file.  Social History Social History  Substance Use Topics  . Smoking status: Current Every Day Smoker    Packs/day: 0.10    Types: Cigarettes  . Smokeless tobacco: Never Used  . Alcohol use Yes    Review of Systems Constitutional: No fever/chills Gastrointestinal: No abdominal pain.  No nausea, no vomiting.  No diarrhea.  No constipation. Genitourinary: Positive for rectal hemorrhoid pain. Musculoskeletal: Negative for back pain. Skin: Negative for rash. Neurological: Negative for headaches, focal weakness or numbness.  10-point ROS otherwise negative.  ____________________________________________   PHYSICAL EXAM:  VITAL SIGNS: ED Triage Vitals  Enc Vitals Group     BP 03/28/16 1730 129/87       Pulse Rate 03/28/16 1730 88     Resp 03/28/16 1730 16     Temp 03/28/16 1730 98.1 F (36.7 C)     Temp Source 03/28/16 1730 Oral     SpO2 03/28/16 1730 99 %     Weight 03/28/16 1728 225 lb (102.1 kg)     Height 03/28/16 1728 6\' 1"  (1.854 m)     Head Circumference --      Peak Flow --      Pain Score 03/28/16 1729 9     Pain Loc --      Pain Edu? --      Excl. in GC? --     Constitutional: Alert and oriented. Well appearing and in no acute distress. Gastrointestinal: Soft and nontender. No distention. No abdominal bruits. No CVA tenderness.Genitourinary exam demonstrates 2 nonthrombosed hemorrhoids slightly tender. Musculoskeletal: No lower extremity tenderness nor edema.  No joint effusions. Neurologic:  Normal speech and language. No gross focal neurologic deficits are appreciated. No gait instability. Skin:  Skin is warm, dry and intact. No rash noted. Psychiatric: Mood and affect are normal. Speech and behavior are normal.  ____________________________________________   LABS (all labs ordered are listed, but only abnormal results are displayed)  Labs Reviewed - No data to display ____________________________________________  EKG   ____________________________________________  RADIOLOGY   ____________________________________________   PROCEDURES  Procedure(s) performed: None  Procedures  Critical Care performed: No  ____________________________________________   INITIAL IMPRESSION / ASSESSMENT AND PLAN / ED COURSE  Pertinent labs & imaging results that were available during my care of the patient were reviewed by me and considered  in my medical decision making (see chart for details).  New-onset hemorrhoids. Rx given for Anusol HC cream and Percocet 5/325. Encouraged patient to use sitz baths and to follow-up with PCP or return ER with any worsening symptomology.  Clinical Course     ____________________________________________   FINAL  CLINICAL IMPRESSION(S) / ED DIAGNOSES  Final diagnoses:  Grade I hemorrhoids      NEW MEDICATIONS STARTED DURING THIS VISIT:  New Prescriptions   HYDROCORTISONE (ANUSOL-HC) 2.5 % RECTAL CREAM    Apply rectally 2 times daily   OXYCODONE-ACETAMINOPHEN (ROXICET) 5-325 MG TABLET    Take 1-2 tablets by mouth every 4 (four) hours as needed for severe pain.     Note:  This document was prepared using Dragon voice recognition software and may include unintentional dictation errors.   Evangeline Dakinharles M Beers, PA-C 03/28/16 1825    Jeanmarie PlantJames A McShane, MD 03/28/16 [phone]

## 2017-09-27 ENCOUNTER — Emergency Department
Admission: EM | Admit: 2017-09-27 | Discharge: 2017-09-27 | Disposition: A | Discharge status: Home / Self Care | Payer: No Typology Code available for payment source | Attending: Emergency Medicine | Admitting: Emergency Medicine

## 2017-09-27 ENCOUNTER — Other Ambulatory Visit: Payer: Self-pay

## 2017-09-27 DIAGNOSIS — S161XXA Strain of muscle, fascia and tendon at neck level, initial encounter: Secondary | ICD-10-CM | POA: Diagnosis not present

## 2017-09-27 DIAGNOSIS — M5412 Radiculopathy, cervical region: Secondary | ICD-10-CM | POA: Insufficient documentation

## 2017-09-27 DIAGNOSIS — Y939 Activity, unspecified: Secondary | ICD-10-CM | POA: Insufficient documentation

## 2017-09-27 DIAGNOSIS — Y92410 Unspecified street and highway as the place of occurrence of the external cause: Secondary | ICD-10-CM | POA: Insufficient documentation

## 2017-09-27 DIAGNOSIS — F17210 Nicotine dependence, cigarettes, uncomplicated: Secondary | ICD-10-CM | POA: Diagnosis not present

## 2017-09-27 DIAGNOSIS — Y998 Other external cause status: Secondary | ICD-10-CM | POA: Insufficient documentation

## 2017-09-27 DIAGNOSIS — S199XXA Unspecified injury of neck, initial encounter: Secondary | ICD-10-CM | POA: Diagnosis present

## 2017-09-27 MED ORDER — METHOCARBAMOL 500 MG PO TABS
1000.0000 mg | ORAL_TABLET | Freq: Once | ORAL | Status: AC
  Administered 2017-09-27: 1000 mg via ORAL
  Filled 2017-09-27: qty 2

## 2017-09-27 MED ORDER — MELOXICAM 15 MG PO TABS: 15.0000 mg | ORAL_TABLET | Freq: Every day | ORAL | 0 refills | Status: DC

## 2017-09-27 MED ORDER — MELOXICAM 7.5 MG PO TABS
15.0000 mg | ORAL_TABLET | Freq: Once | ORAL | Status: AC
  Administered 2017-09-27: 15 mg via ORAL
  Filled 2017-09-27: qty 2

## 2017-09-27 MED ORDER — METHOCARBAMOL 500 MG PO TABS: 500.0000 mg | ORAL_TABLET | Freq: Four times a day (QID) | ORAL | 0 refills | Status: DC
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
## 2017-09-27 NOTE — ED Notes (Signed)
Pt ambulatory upon discharge. Verbalized understanding of discharge instructions, follow-up care and prescriptions. VSS. Skin warm and dry. A&O x4.  
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
## 2019-05-03 ENCOUNTER — Other Ambulatory Visit: Payer: Self-pay

## 2019-05-03 DIAGNOSIS — Z20822 Contact with and (suspected) exposure to covid-19: Secondary | ICD-10-CM

## 2019-05-06 LAB — NOVEL CORONAVIRUS, NAA: SARS-CoV-2, NAA: NOT DETECTED

## 2019-09-28 ENCOUNTER — Other Ambulatory Visit: Payer: Self-pay

## 2019-09-28 ENCOUNTER — Emergency Department
Admission: EM | Admit: 2019-09-28 | Discharge: 2019-09-28 | Disposition: A | Discharge status: Home / Self Care | Payer: Self-pay | Attending: Student | Admitting: Student

## 2019-09-28 DIAGNOSIS — R195 Other fecal abnormalities: Secondary | ICD-10-CM | POA: Insufficient documentation

## 2019-09-28 DIAGNOSIS — R1030 Lower abdominal pain, unspecified: Secondary | ICD-10-CM | POA: Insufficient documentation

## 2019-09-28 DIAGNOSIS — Z20822 Contact with and (suspected) exposure to covid-19: Secondary | ICD-10-CM | POA: Insufficient documentation

## 2019-09-28 DIAGNOSIS — F17210 Nicotine dependence, cigarettes, uncomplicated: Secondary | ICD-10-CM | POA: Insufficient documentation

## 2019-09-28 LAB — COMPREHENSIVE METABOLIC PANEL
ALT: 20 U/L (ref 0–44)
AST: 25 U/L (ref 15–41)
Albumin: 4.6 g/dL (ref 3.5–5.0)
Alkaline Phosphatase: 51 U/L (ref 38–126)
Anion gap: 10 (ref 5–15)
BUN: 18 mg/dL (ref 6–20)
CO2: 28 mmol/L (ref 22–32)
Calcium: 9.5 mg/dL (ref 8.9–10.3)
Chloride: 103 mmol/L (ref 98–111)
Creatinine, Ser: 1.06 mg/dL (ref 0.61–1.24)
GFR calc Af Amer: >60 mL/min (ref >60)
GFR calc non Af Amer: >60 mL/min (ref >60)
Glucose, Bld: 103 mg/dL — ABNORMAL HIGH (ref 70–99)
Potassium: 3.9 mmol/L (ref 3.5–5.1)
Sodium: 141 mmol/L (ref 135–145)
Total Bilirubin: 0.7 mg/dL (ref 0.3–1.2)
Total Protein: 7.5 g/dL (ref 6.5–8.1)

## 2019-09-28 LAB — URINALYSIS, COMPLETE (UACMP) WITH MICROSCOPIC
Bacteria, UA: NONE SEEN
Bilirubin Urine: NEGATIVE
Glucose, UA: NEGATIVE mg/dL
Hgb urine dipstick: NEGATIVE
Ketones, ur: NEGATIVE mg/dL
Leukocytes,Ua: NEGATIVE
Nitrite: NEGATIVE
Protein, ur: 30 mg/dL — AB
Specific Gravity, Urine: 1.023 (ref 1.005–1.030)
Squamous Epithelial / LPF: NONE SEEN (ref 0–5)
pH: 7 (ref 5.0–8.0)

## 2019-09-28 LAB — CBC
HCT: 42.5 % (ref 39.0–52.0)
Hemoglobin: 14.7 g/dL (ref 13.0–17.0)
MCH: 32.7 pg (ref 26.0–34.0)
MCHC: 34.6 g/dL (ref 30.0–36.0)
MCV: 94.7 fL (ref 80.0–100.0)
Platelets: 183 10*3/uL (ref 150–400)
RBC: 4.49 MIL/uL (ref 4.22–5.81)
RDW: 12.2 % (ref 11.5–15.5)
WBC: 6 10*3/uL (ref 4.0–10.5)
nRBC: 0 % (ref 0.0–0.2)

## 2019-09-28 LAB — RESPIRATORY PANEL BY RT PCR (FLU A&B, COVID)
Influenza A by PCR: NEGATIVE
Influenza B by PCR: NEGATIVE
SARS Coronavirus 2 by RT PCR: NEGATIVE

## 2019-09-28 LAB — LIPASE, BLOOD: Lipase: 23 U/L (ref 11–51)

## 2019-09-28 LAB — POC SARS CORONAVIRUS 2 AG: SARS Coronavirus 2 Ag: NEGATIVE

## 2019-09-28 MED ORDER — DICYCLOMINE HCL 10 MG PO CAPS
10.0000 mg | ORAL_CAPSULE | Freq: Once | ORAL | Status: AC
  Administered 2019-09-28: 10 mg via ORAL
  Filled 2019-09-28: qty 1

## 2019-09-28 MED ORDER — ACETAMINOPHEN 500 MG PO TABS
1000.0000 mg | ORAL_TABLET | Freq: Once | ORAL | Status: AC
  Administered 2019-09-28: 1000 mg via ORAL
  Filled 2019-09-28: qty 2

## 2019-09-28 MED ORDER — KETOROLAC TROMETHAMINE 60 MG/2ML IM SOLN
30.0000 mg | Freq: Once | INTRAMUSCULAR | Status: AC
Start: 2019-09-28 — End: 2019-09-28
  Administered 2019-09-28: 30 mg via INTRAMUSCULAR
  Filled 2019-09-28: qty 2

## 2019-09-28 NOTE — Discharge Instructions (Addendum)
Thank you for letting us take care of you in the emergency department.   At this time, your COVID swab results are pending.   In the meantime, it is important to take precautions in case you are positive. This includes quarantining, wearing a mask, and social distancing.   Continue to take over the counter acetaminophen and ibuprofen as directed on the box to help with fevers as well as aches and pains.   Please return to the ER for any new or worsening symptoms, such as difficulty breathing, vomiting and diarrhea, or chest pain.

## 2019-09-28 NOTE — ED Provider Notes (Addendum)
[facility] Emergency Department Provider Note  ____________________________________________   First MD Initiated Contact with Patient 09/28/19 1359     (approximate)  I have reviewed the triage vital signs and the nursing notes.  History  Chief Complaint Abdominal Pain    HPI William Mendoza is a 34 y.o. male who presents to the emergency department for feeling generally unwell, including headaches, loose stool, abdominal discomfort in the setting of a close COVID exposure.  Patient states symptoms have been ongoing since Friday.  Complains of an intermittent headache.  Not thunderclap or worst at onset.  Not worst of life.  Seems to wax and wane in severity.  Also complains of some abdominal cramping.  Moderate in severity.  Lower in location, no radiation, no apparent alleviating or aggravating components.  Associated with loose stool, but not frank diarrhea.  No nausea or vomiting.  No changes to smell or taste.  No shortness of breath or difficulty breathing.  Maintains his appetite.  Was recently in close contact with his sister over the weekend who was swabbed positive for COVID yesterday.   Past Medical Hx History reviewed. No pertinent past medical history.  Problem List There are no problems to display for this patient.   Past Surgical Hx History reviewed. No pertinent surgical history.  Medications Prior to Admission medications   Medication Sig Start Date End Date Taking? Authorizing Provider  meloxicam (MOBIC) 15 MG tablet Take 1 tablet (15 mg total) by mouth daily. 09/27/17   Cuthriell, Delorise Royals, PA-C  methocarbamol (ROBAXIN) 500 MG tablet Take 1 tablet (500 mg total) by mouth 4 (four) times daily. 09/27/17   Cuthriell, Delorise Royals, PA-C  oxyCODONE-acetaminophen (ROXICET) 5-325 MG tablet Take 1-2 tablets by mouth every 4 (four) hours as needed for severe pain. 03/28/16   Beers, Charmayne Sheer, PA-C    Allergies Penicillins  Family Hx History  reviewed. No pertinent family history.  Social Hx Social History   Tobacco Use  . Smoking status: Current Every Day Smoker    Packs/day: 0.10    Types: Cigarettes  . Smokeless tobacco: Never Used  Substance Use Topics  . Alcohol use: Yes  . Drug use: Not on file     Review of Systems  Constitutional: Negative for fever. Negative for chills. Eyes: Negative for visual changes. ENT: Negative for sore throat. Cardiovascular: Negative for chest pain. Respiratory: Negative for shortness of breath. Gastrointestinal: Negative for nausea. Negative for vomiting.  Positive for abdominal cramping, loose stool. Genitourinary: Negative for dysuria. Musculoskeletal: Negative for leg swelling. Skin: Negative for rash. Neurological: Positive for headaches.   Physical Exam  Vital Signs: ED Triage Vitals [09/28/19 1300]  Enc Vitals Group     BP 131/79     Pulse Rate 90     Resp 16     Temp 98.5 F (36.9 C)     Temp Source Oral     SpO2 100 %     Weight 190 lb (86.2 kg)     Height 6' (1.829 m)     Head Circumference      Peak Flow      Pain Score 10     Pain Loc      Pain Edu?      Excl. in GC?     Constitutional: Alert and oriented. Well appearing. NAD.  Head: Normocephalic. Atraumatic. Eyes: Conjunctivae clear. Sclera anicteric. Pupils equal and symmetric. Nose: No masses or lesions. No congestion or rhinorrhea. Mouth/Throat: Wearing  mask.  Neck: No stridor. Trachea midline.  Cardiovascular: Normal rate, regular rhythm. Extremities well perfused. Respiratory: Normal respiratory effort.  Lungs CTAB. Gastrointestinal: Soft. Non-distended. Non-tender.  No rebound, guarding, or rigidity. Genitourinary: Deferred. Musculoskeletal: No lower extremity edema. No deformities. Neurologic:  Normal speech and language. No gross focal or lateralizing neurologic deficits are appreciated.  Skin: Skin is warm, dry and intact. No rash noted. Psychiatric: Mood and affect are appropriate  for situation.  EKG  N/A    Radiology  N/A   Procedures  Procedure(s) performed (including critical care):  Procedures   Initial Impression / Assessment and Plan / MDM / ED Course  34 y.o. male who presents to the ED for feeling generally unwell, intermittent headaches, abdominal cramping, loose stool in the setting of covid exposure  Ddx: COVID, other viral process, gastroenteritis, enteritis, electrolyte abnormality  Will plan for labs, urine studies, COVID testing.  Abdominal exam is benign and reassuring at this time, do not feel emergent imaging is indicated at this time.  Symptomatic treatment and reassess.  Labs without actionable derangements.  UA negative for infection.  COVID antigen testing is negative, will proceed with PCR testing.  Patient is otherwise stable and desires discharge at this time.  He understands his COVID PCR testing is pending and the need for appropriate social distancing/quarantine until results.  Otherwise advised supportive care, outpatient follow-up, given return precautions.   _______________________________   As part of my medical decision making I have reviewed available labs, radiology tests, reviewed old records/chart review.    Final Clinical Impression(s) / ED Diagnosis  Final diagnoses:  Loose stools  Close exposure to COVID-19 virus       Note:  This document was prepared using Dragon voice recognition software and may include unintentional dictation errors.   Lilia Pro., MD 09/28/19 2142    Lilia Pro., MD 09/28/19 [phone]

## 2019-09-28 NOTE — ED Triage Notes (Signed)
Pt states lower abd pain since Friday. Has been drinking ginger ale and taking immodium. States "it's not like diarrhea but its loose." also c/o intermittent HA. A&O, ambulatory. States was around sister this weekend and she was dx with COVID yesterday.

## 2022-08-10 ENCOUNTER — Emergency Department
Admission: EM | Admit: 2022-08-10 | Discharge: 2022-08-10 | Disposition: A | Discharge status: Home / Self Care | Payer: Self-pay | Attending: Emergency Medicine | Admitting: Emergency Medicine

## 2022-08-10 ENCOUNTER — Encounter: Payer: Self-pay | Admitting: Emergency Medicine

## 2022-08-10 DIAGNOSIS — L03317 Cellulitis of buttock: Secondary | ICD-10-CM | POA: Insufficient documentation

## 2022-08-10 MED ORDER — HYDROCODONE-ACETAMINOPHEN 5-325 MG PO TABS: 1.0000 | ORAL_TABLET | Freq: Four times a day (QID) | ORAL | 0 refills | Status: AC | PRN

## 2022-08-10 MED ORDER — SULFAMETHOXAZOLE-TRIMETHOPRIM 800-160 MG PO TABS: 1.0000 | ORAL_TABLET | Freq: Two times a day (BID) | ORAL | 0 refills | Status: AC

## 2022-08-10 MED ORDER — SULFAMETHOXAZOLE-TRIMETHOPRIM 800-160 MG PO TABS
1.0000 | ORAL_TABLET | Freq: Once | ORAL | Status: AC
  Administered 2022-08-10: 1 via ORAL
  Filled 2022-08-10: qty 1

## 2022-08-10 MED ORDER — HYDROCODONE-ACETAMINOPHEN 5-325 MG PO TABS
1.0000 | ORAL_TABLET | Freq: Once | ORAL | Status: AC
  Administered 2022-08-10: 1 via ORAL
  Filled 2022-08-10: qty 1

## 2022-08-10 MED ORDER — LIDOCAINE-EPINEPHRINE (PF) 2 %-1:200000 IJ SOLN
10.0000 mL | Freq: Once | INTRAMUSCULAR | Status: AC
  Administered 2022-08-10: 10 mL
  Filled 2022-08-10: qty 20

## 2022-08-10 MED ORDER — IBUPROFEN 800 MG PO TABS: 800.0000 mg | ORAL_TABLET | Freq: Three times a day (TID) | ORAL | 0 refills | Status: AC | PRN

## 2022-08-10 NOTE — ED Triage Notes (Signed)
Pt reports abscess to sacral area about 2 days ago.

## 2022-08-10 NOTE — Discharge Instructions (Addendum)
-  Please take the full course of the Bactrim as prescribed.  For the pain, you may take ibuprofen.  If needed, you may take hydrocodone as well, the use caution as may make you dizzy/drowsy.  It can also be addicting.  -Please follow-up with the general surgeon listed on this page if you continue to have these infections.  You may call to schedule appointment utilizing the contact information listed on this page.  -If your symptoms worsen or fail to improve, please return to the emergency department anytime.

## 2022-08-10 NOTE — ED Notes (Signed)
Walked in and introduced myself to patient. Provider at bedside. Patient describes abscess at gluteal cleft. Pain is 10/10. Family isat bedside.

## 2022-08-10 NOTE — ED Provider Notes (Signed)
[facility] Provider Note    Event Date/Time   First MD Initiated Contact with Patient 08/10/22 1912     (approximate)   History   Chief Complaint Abscess   HPI William Mendoza is a 37 y.o. male, no significant medical history, presents to the emergency department for evaluation of suspected abscess.  Patient states that he has noticed increased pain, redness, and swelling along the sacral region for the past 2 days.  Reports extreme discomfort while sitting down.  He states that he has had these occur to him before, however they usually go away.  Denies fever/chills, myalgias, chest pain, shortness of breath, abdominal pain, weakness, paresthesias, or dizziness/lightheadedness.  History Limitations: No limitations        Physical Exam  Triage Vital Signs: ED Triage Vitals [08/10/22 1857]  Enc Vitals Group     BP (!) 142/94     Pulse Rate 99     Resp 16     Temp 98.3 F (36.8 C)     Temp Source Oral     SpO2 100 %     Weight 189 lb 9.5 oz (86 kg)     Height 6' (1.829 m)     Head Circumference      Peak Flow      Pain Score 10     Pain Loc      Pain Edu?      Excl. in [place]?     Most recent vital signs: Vitals:   08/10/22 1857 08/10/22 1940  BP: (!) 142/94 112/85  Pulse: 99 80  Resp: 16 17  Temp: 98.3 F (36.8 C)   SpO2: 100% 99%    General: Awake, NAD.  Skin: Warm, dry. No rashes or lesions.  Eyes: PERRL. Conjunctivae normal.  CV: Good peripheral perfusion.  Resp: Normal effort.  Abd: Soft, non-tender. No distention.  Neuro: At baseline. No gross neurological deficits.  Musculoskeletal: Normal ROM of all extremities.  Focused Exam: Diffuse erythema and tenderness across the sacral region.  Significant induration on both sides of the superior gluteal cleft.  No obvious fluctuance.  No active bleeding or discharge.  Ultrasound shows mostly cobblestoning with small area suggestive of possible abscess development.  Physical  Exam    ED Results / Procedures / Treatments  Labs (all labs ordered are listed, but only abnormal results are displayed) Labs Reviewed - No data to display   EKG N/A.    RADIOLOGY  ED Provider Interpretation: N/A.  No results found.  PROCEDURES:  Critical Care performed: N/A.  Ultrasound ED Soft Tissue  Date/Time: 08/10/2022 7:49 PM  Performed by: Teodoro Spray, PA Authorized by: Teodoro Spray, PA   Procedure details:    Indications: localization of abscess and evaluate for cellulitis     Transverse view:  Visualized   Longitudinal view:  Visualized Location:    Location: buttocks     Side:  Midline Findings:     no abscess present    cellulitis present    no foreign body present .Marland KitchenIncision and Drainage  Date/Time: 08/10/2022 7:49 PM  Performed by: Teodoro Spray, PA Authorized by: Teodoro Spray, PA   Consent:    Consent obtained:  Verbal   Consent given by:  Patient   Risks, benefits, and alternatives were discussed: yes     Risks discussed:  Bleeding, damage to other organs, infection, incomplete drainage and pain   Alternatives discussed:  No treatment Universal protocol:  Patient identity confirmed:  Verbally with patient Location:    Size:  1 cm   Location:  Anogenital   Anogenital location:  Pilonidal Pre-procedure details:    Skin preparation:  Antiseptic wash Sedation:    Sedation type:  None Anesthesia:    Anesthesia method:  Local infiltration   Local anesthetic:  Lidocaine 2% WITH epi Procedure type:    Complexity:  Simple Procedure details:    Ultrasound guidance: yes     Incision types:  Stab incision   Drainage:  Bloody   Drainage amount:  Scant   Wound treatment:  Wound left open   Packing materials:  None Post-procedure details:    Procedure completion:  Tolerated well, no immediate complications     MEDICATIONS ORDERED IN ED: Medications  sulfamethoxazole-trimethoprim (BACTRIM DS) 800-160  MG per tablet 1 tablet (has no administration in time range)  HYDROcodone-acetaminophen (NORCO/VICODIN) 5-325 MG per tablet 1 tablet (has no administration in time range)  lidocaine-EPINEPHrine (XYLOCAINE W/EPI) 2 %-1:200000 (PF) injection 10 mL (10 mLs Infiltration Given 08/10/22 1927)     IMPRESSION / MDM / [place] / ED COURSE  I reviewed the triage vital signs and the nursing notes.                              Differential diagnosis includes, but is not limited to, pilonidal cyst, and a rectal abscess, buttock/sacral cellulitis.  Assessment/Plan Presentation consistent with cellulitis of the buttock/sacral region.  No signs of systemic infection.  Point-of-care ultrasound did show small areas of possible early abscess development, suggestive of possible developing pilonidal abscess.  Attempted incision and drainage, however was unable to elicit any significant drainage.  Will treat him with a course of antibiotics.  Provide him with analgesics as well.  Provide him with a referral to general surgery for reevaluation as the has had frequent infections in this region.  Encouraged him to return to the emergency department if his symptoms worsen or fail to improve.  He was amenable to this.  Will discharge.  Provided the patient with anticipatory guidance, return precautions, and educational material. Encouraged the patient to return to the emergency department at any time if they begin to experience any new or worsening symptoms. Patient expressed understanding and agreed with the plan.   Patient's presentation is most consistent with acute, uncomplicated illness.       FINAL CLINICAL IMPRESSION(S) / ED DIAGNOSES   Final diagnoses:  Cellulitis of buttock     Rx / DC Orders   ED Discharge Orders          Ordered    sulfamethoxazole-trimethoprim (BACTRIM DS) 800-160 MG tablet  2 times daily        08/10/22 1944    HYDROcodone-acetaminophen (NORCO/VICODIN) 5-325 MG  tablet  Every 6 hours PRN        08/10/22 1944    ibuprofen (ADVIL) 800 MG tablet  Every 8 hours PRN        08/10/22 1944             Note:  This document was prepared using Dragon voice recognition software and may include unintentional dictation errors.   Teodoro Spray, [place] 08/10/22 1950    Harvest Dark, MD 08/10/22 2246

## 2023-03-10 ENCOUNTER — Emergency Department
Admission: EM | Admit: 2023-03-10 | Discharge: 2023-03-10 | Disposition: A | Discharge status: Home / Self Care | Payer: Self-pay | Attending: Emergency Medicine | Admitting: Emergency Medicine

## 2023-03-10 ENCOUNTER — Other Ambulatory Visit: Payer: Self-pay

## 2023-03-10 DIAGNOSIS — R202 Paresthesia of skin: Secondary | ICD-10-CM | POA: Insufficient documentation

## 2023-03-10 DIAGNOSIS — U071 COVID-19: Secondary | ICD-10-CM | POA: Insufficient documentation

## 2023-03-10 LAB — COMPREHENSIVE METABOLIC PANEL
ALT: 20 U/L (ref 0–44)
AST: 24 U/L (ref 15–41)
Albumin: 4.5 g/dL (ref 3.5–5.0)
Alkaline Phosphatase: 69 U/L (ref 38–126)
Anion gap: 9 (ref 5–15)
BUN: 15 mg/dL (ref 6–20)
CO2: 28 mmol/L (ref 22–32)
Calcium: 9.3 mg/dL (ref 8.9–10.3)
Chloride: 99 mmol/L (ref 98–111)
Creatinine, Ser: 0.99 mg/dL (ref 0.61–1.24)
GFR, Estimated: >60 mL/min (ref >60)
Glucose, Bld: 102 mg/dL — ABNORMAL HIGH (ref 70–99)
Potassium: 3.9 mmol/L (ref 3.5–5.1)
Sodium: 136 mmol/L (ref 135–145)
Total Bilirubin: 0.9 mg/dL (ref 0.3–1.2)
Total Protein: 7.8 g/dL (ref 6.5–8.1)

## 2023-03-10 LAB — CBC WITH DIFFERENTIAL/PLATELET
Abs Immature Granulocytes: 0.01 10*3/uL (ref 0.00–0.07)
Basophils Absolute: 0 10*3/uL (ref 0.0–0.1)
Basophils Relative: 1 %
Eosinophils Absolute: 0.4 10*3/uL (ref 0.0–0.5)
Eosinophils Relative: 7 %
HCT: 45.2 % (ref 39.0–52.0)
Hemoglobin: 15.8 g/dL (ref 13.0–17.0)
Immature Granulocytes: 0 %
Lymphocytes Relative: 30 %
Lymphs Abs: 1.6 10*3/uL (ref 0.7–4.0)
MCH: 32.4 pg (ref 26.0–34.0)
MCHC: 35 g/dL (ref 30.0–36.0)
MCV: 92.8 fL (ref 80.0–100.0)
Monocytes Absolute: 0.5 10*3/uL (ref 0.1–1.0)
Monocytes Relative: 10 %
Neutro Abs: 2.9 10*3/uL (ref 1.7–7.7)
Neutrophils Relative %: 52 %
Platelets: 203 10*3/uL (ref 150–400)
RBC: 4.87 MIL/uL (ref 4.22–5.81)
RDW: 12.5 % (ref 11.5–15.5)
WBC: 5.4 10*3/uL (ref 4.0–10.5)
nRBC: 0 % (ref 0.0–0.2)

## 2023-03-10 LAB — RESP PANEL BY RT-PCR (RSV, FLU A&B, COVID)  RVPGX2
Influenza A by PCR: NEGATIVE
Influenza B by PCR: NEGATIVE
Resp Syncytial Virus by PCR: NEGATIVE
SARS Coronavirus 2 by RT PCR: POSITIVE — AB

## 2023-03-10 NOTE — ED Notes (Signed)
See triage note  Presents with headache and body aches  States he started feeling bad on Thursday Currently afebrile

## 2023-03-10 NOTE — ED Triage Notes (Signed)
Pt states that he has had a HA since Thursday and has been taking OTC medications without relief. Report's left arm pain that started yesterday. Pt states that he has been fatigued, loss of appetite, cough, and frequent BM's (not diarrhea per pt). Pt states that daughter had covid 2 weeks ago. Pt reports a vibration feeling in his left arm that started this morning. Describes it as a numbness, but when sensory checked for NIH patient reports feeling to be the same in both arms. Pt states that it feels like he pulled a muscle in his upper arm or like he hit his funny bone. NIH 0.

## 2023-03-10 NOTE — ED Provider Notes (Signed)
   [facility] Provider Note    Event Date/Time   First MD Initiated Contact with Patient 03/10/23 1409     (approximate)   History   Headache   HPI  William Mendoza is a 37 y.o. male who presents with complaints of headache, fatigue, mild nasal congestion and occasional tingling in his arms bilaterally.     Physical Exam   Triage Vital Signs: ED Triage Vitals  Encounter Vitals Group     BP 03/10/23 1307 125/87     Systolic BP Percentile --      Diastolic BP Percentile --      Pulse Rate 03/10/23 1307 93     Resp 03/10/23 1307 18     Temp 03/10/23 1307 98 F (36.7 C)     Temp Source 03/10/23 1307 Oral     SpO2 03/10/23 1307 100 %     Weight 03/10/23 1309 83.9 kg (185 lb)     Height 03/10/23 1309 1.854 m (6\' 1" )     Head Circumference --      Peak Flow --      Pain Score 03/10/23 1308 9     Pain Loc --      Pain Education --      Exclude from Growth Chart --     Most recent vital signs: Vitals:   03/10/23 1307  BP: 125/87  Pulse: 93  Resp: 18  Temp: 98 F (36.7 C)  SpO2: 100%     General: Awake, no distress.  CV:  Good peripheral perfusion.  Resp:  Normal effort.  Abd:  No distention.  Other:  Normal neuroexam, normal strength in the upper and lower extremities, cranial nerves II through XII are normal   ED Results / Procedures / Treatments   Labs (all labs ordered are listed, but only abnormal results are displayed) Labs Reviewed  RESP PANEL BY RT-PCR (RSV, FLU A&B, COVID)  [id number] - Abnormal; Notable for the following components:      Result Value   SARS Coronavirus 2 by RT PCR POSITIVE (*)    All other components within normal limits  COMPREHENSIVE METABOLIC PANEL - Abnormal; Notable for the following components:   Glucose, Bld 102 (*)    All other components within normal limits  CBC WITH DIFFERENTIAL/PLATELET     EKG     RADIOLOGY     PROCEDURES:  Critical Care performed:    Procedures   MEDICATIONS ORDERED IN ED: Medications - No data to display   IMPRESSION / MDM / ASSESSMENT AND PLAN / ED COURSE  I reviewed the triage vital signs and the nursing notes. Patient's presentation is most consistent with acute illness / injury with system symptoms.  Patient with symptoms as above, suspect COVID or other viral illness given his symptoms, will send PCR  Work reviewed and is overall reassuring, COVID test is positive, recommend supportive care, outpatient follow-up        FINAL CLINICAL IMPRESSION(S) / ED DIAGNOSES   Final diagnoses:  COVID-19     Rx / DC Orders   ED Discharge Orders     None        Note:  This document was prepared using Dragon voice recognition software and may include unintentional dictation errors.   Jene Every, MD 03/10/23 1520

## 2023-07-20 ENCOUNTER — Emergency Department
Admission: EM | Admit: 2023-07-20 | Discharge: 2023-07-20 | Discharge status: Against Medical Advice | Payer: Self-pay | Attending: Emergency Medicine | Admitting: Emergency Medicine

## 2023-07-20 ENCOUNTER — Emergency Department: Payer: Self-pay

## 2023-07-20 DIAGNOSIS — R519 Headache, unspecified: Secondary | ICD-10-CM | POA: Insufficient documentation

## 2023-07-20 DIAGNOSIS — Y92410 Unspecified street and highway as the place of occurrence of the external cause: Secondary | ICD-10-CM | POA: Insufficient documentation

## 2023-07-20 DIAGNOSIS — Z5321 Procedure and treatment not carried out due to patient leaving prior to being seen by health care provider: Secondary | ICD-10-CM | POA: Diagnosis not present

## 2023-07-20 DIAGNOSIS — M25571 Pain in right ankle and joints of right foot: Secondary | ICD-10-CM | POA: Diagnosis not present
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
## 2023-07-20 NOTE — ED Notes (Signed)
Called multiple times for triage. Pt not visualized in the lobby

## 2023-07-21 NOTE — ED Notes (Signed)
 Patient discharged with all belongings. Discharge instructions reviewed. Patient verbalized understanding. VSS, NAD, pt out of department on crutches

## 2024-01-16 ENCOUNTER — Emergency Department
Admission: EM | Admit: 2024-01-16 | Discharge: 2024-01-16 | Disposition: A | Discharge status: Home / Self Care | Payer: Self-pay

## 2024-01-16 ENCOUNTER — Other Ambulatory Visit: Payer: Self-pay

## 2024-01-16 DIAGNOSIS — L0291 Cutaneous abscess, unspecified: Secondary | ICD-10-CM

## 2024-01-16 DIAGNOSIS — L0231 Cutaneous abscess of buttock: Secondary | ICD-10-CM | POA: Insufficient documentation

## 2024-01-16 MED ORDER — SULFAMETHOXAZOLE-TRIMETHOPRIM 800-160 MG PO TABS: 1.0000 | ORAL_TABLET | Freq: Two times a day (BID) | ORAL | 0 refills | Status: AC

## 2024-01-16 MED ORDER — LIDOCAINE HCL (PF) 1 % IJ SOLN
5.0000 mL | Freq: Once | INTRAMUSCULAR | Status: AC
  Administered 2024-01-16: 5 mL via INTRADERMAL
  Filled 2024-01-16: qty 5

## 2024-01-16 NOTE — Discharge Instructions (Addendum)
 The abscess on your bottom was drained today.  It was packed using ribbon gauze.  In 2 days please pull all of this out.  I recommend doing it in the shower.  This may continue to drain.  That is the goal.  Please soak in warm soapy water in a bathtub twice a day.  That will help encourage it to continue to drain as well.  After soaking, pat dry and cover with gauze or a bandage.  You can apply triple antibiotic ointment if you prefer.  Please take the antibiotics as prescribed.  I attached information for general surgery, you can schedule a follow-up appointment with them.  Return to the emergency department with any worsening symptoms.  Please read the attached informational packets.

## 2024-01-16 NOTE — ED Triage Notes (Signed)
 Pt comes in via pov with complains of a possible between the upper part of the buttocks. Pt states he noticed them there on Monday. Pt has had these occurrences before, and usually gets them when he starts working out. He has been prescribed medications in the past for treatment. Pt has been doing warm compresses to try to alleviate the pain with no relief. Pt with swelling and hardening to both sides of the area. Pt complains of pain 10/10.

## 2024-01-16 NOTE — ED Provider Notes (Signed)
 [facility] Provider Note    Event Date/Time   First MD Initiated Contact with Patient 01/16/24 1226     (approximate)   History   Abscess   HPI  William Mendoza is a 38 y.o. male with PMH of abscesses presents for evaluation of another abscess at the top of his buttocks.  Patient states he first noticed it on Monday and it sometimes goes away on its own.  Thought it was getting better but had increased pain and swelling this morning so he came to the emergency department.  Thinks that it may be due to to hair in the area.  Has not noticed any drainage.  Very uncomfortable for him to sit down.      Physical Exam   Triage Vital Signs: ED Triage Vitals  Encounter Vitals Group     BP 01/16/24 1212 121/76     Girls Systolic BP Percentile --      Girls Diastolic BP Percentile --      Boys Systolic BP Percentile --      Boys Diastolic BP Percentile --      Pulse Rate 01/16/24 1212 85     Resp 01/16/24 1212 17     Temp 01/16/24 1212 99 F (37.2 C)     Temp src --      SpO2 01/16/24 1212 99 %     Weight 01/16/24 1213 184 lb 15.5 oz (83.9 kg)     Height 01/16/24 1213 6' 1 (1.854 m)     Head Circumference --      Peak Flow --      Pain Score 01/16/24 1213 10     Pain Loc --      Pain Education --      Exclude from Growth Chart --     Most recent vital signs: Vitals:   01/16/24 1212  BP: 121/76  Pulse: 85  Resp: 17  Temp: 99 F (37.2 C)  SpO2: 99%   General: Awake, no distress.  CV:  Good peripheral perfusion.  Resp:  Normal effort.  Abd:  No distention.  Other:  Area of erythema, induration at the top of the gluteal cleft in the midline, very TTP, no fluctuance   ED Results / Procedures / Treatments   Labs (all labs ordered are listed, but only abnormal results are displayed) Labs Reviewed - No data to display   PROCEDURES:  Critical Care performed: No  .Ultrasound ED Soft Tissue  Date/Time: 01/16/2024 1:09 PM  Performed  by: Cleaster Tinnie LABOR, PA-C Authorized by: Cleaster Tinnie LABOR, PA-C   Procedure details:    Indications: localization of abscess     Transverse view:  Visualized   Longitudinal view:  Visualized   Images: not archived     Limitations:  Patient compliance Location:    Location: buttocks     Side:  Midline Findings:     abscess present .Incision and Drainage  Date/Time: 01/16/2024 1:49 PM  Performed by: Cleaster Tinnie LABOR, PA-C Authorized by: Cleaster Tinnie LABOR, PA-C   Consent:    Consent obtained:  Verbal   Consent given by:  Patient   Risks, benefits, and alternatives were discussed: yes     Risks discussed:  Bleeding, incomplete drainage, pain and infection   Alternatives discussed:  No treatment and alternative treatment Universal protocol:    Patient identity confirmed:  Verbally with patient Location:    Type:  Abscess   Size:  2 cm  Location:  Anogenital   Anogenital location:  Gluteal cleft Pre-procedure details:    Skin preparation:  Povidone-iodine Sedation:    Sedation type:  None Anesthesia:    Anesthesia method:  Local infiltration   Local anesthetic:  Lidocaine  1% w/o epi Procedure type:    Complexity:  Complex Procedure details:    Incision types:  Single straight   Incision depth:  Dermal   Wound management:  Probed and deloculated and irrigated with saline   Drainage:  Bloody and purulent   Drainage amount:  Moderate   Wound treatment:  Wound left open   Packing materials:  1/4 in iodoform gauze Post-procedure details:    Procedure completion:  Tolerated well, no immediate complications    MEDICATIONS ORDERED IN ED: Medications  lidocaine  (PF) (XYLOCAINE ) 1 % injection 5 mL (5 mLs Intradermal Given by Other 01/16/24 1335)     IMPRESSION / MDM / ASSESSMENT AND PLAN / ED COURSE  I reviewed the triage vital signs and the nursing notes.                             37 year old male presents for evaluation of an abscess.  Vital signs are  stable patient a bit uncomfortable on exam.  Differential diagnosis includes, but is not limited to, abscess, cellulitis, folliculitis.  Patient's presentation is most consistent with acute, uncomplicated illness.  Area of concern was evaluated with bedside ultrasound because I was not sure if this was cellulitis or an abscess.  Was able to localize the fluid collection using ultrasound to proceeded with I&D.  See procedure note for further details.  Patient was instructed on wound care.  Will send oral antibiotics.  Advised patient to follow-up with general surgery but he states he does not have any insurance.  Discussed over-the-counter medications for pain management.  Patient voiced understanding, all questions were answered and he is stable at discharge.   FINAL CLINICAL IMPRESSION(S) / ED DIAGNOSES   Final diagnoses:  Abscess     Rx / DC Orders   ED Discharge Orders          Ordered    sulfamethoxazole -trimethoprim  (BACTRIM  DS) 800-160 MG tablet  2 times daily        01/16/24 1356             Note:  This document was prepared using Dragon voice recognition software and may include unintentional dictation errors.   Cleaster Tinnie LABOR, PA-C 01/16/24 1359    Clarine Ozell LABOR, MD 01/16/24 1911

## 2024-01-16 NOTE — ED Notes (Signed)
 See triage note  Presents with possible abscess area to lower back  Noticed area on Monday  Hx of same in past

## 2024-07-07 ENCOUNTER — Emergency Department
Admission: EM | Admit: 2024-07-07 | Discharge: 2024-07-07 | Disposition: A | Discharge status: Home / Self Care | Payer: Self-pay

## 2024-07-07 ENCOUNTER — Emergency Department: Payer: Self-pay

## 2024-07-07 ENCOUNTER — Other Ambulatory Visit: Payer: Self-pay

## 2024-07-07 ENCOUNTER — Encounter: Payer: Self-pay | Admitting: Emergency Medicine

## 2024-07-07 DIAGNOSIS — R55 Syncope and collapse: Secondary | ICD-10-CM | POA: Insufficient documentation

## 2024-07-07 LAB — TROPONIN T, HIGH SENSITIVITY: Troponin T High Sensitivity: <15 ng/L (ref 0–19)

## 2024-07-07 LAB — COMPREHENSIVE METABOLIC PANEL WITH GFR
ALT: 16 U/L (ref 0–44)
AST: 24 U/L (ref 15–41)
Albumin: 4.6 g/dL (ref 3.5–5.0)
Alkaline Phosphatase: 71 U/L (ref 38–126)
Anion gap: 10 (ref 5–15)
BUN: 16 mg/dL (ref 6–20)
CO2: 31 mmol/L (ref 22–32)
Calcium: 9.8 mg/dL (ref 8.9–10.3)
Chloride: 98 mmol/L (ref 98–111)
Creatinine, Ser: 0.96 mg/dL (ref 0.61–1.24)
GFR, Estimated: >60 mL/min
Glucose, Bld: 84 mg/dL (ref 70–99)
Potassium: 4.5 mmol/L (ref 3.5–5.1)
Sodium: 139 mmol/L (ref 135–145)
Total Bilirubin: 0.6 mg/dL (ref 0.0–1.2)
Total Protein: 7.7 g/dL (ref 6.5–8.1)

## 2024-07-07 LAB — CBC
HCT: 44.1 % (ref 39.0–52.0)
Hemoglobin: 15.3 g/dL (ref 13.0–17.0)
MCH: 32.6 pg (ref 26.0–34.0)
MCHC: 34.7 g/dL (ref 30.0–36.0)
MCV: 93.8 fL (ref 80.0–100.0)
Platelets: 201 K/uL (ref 150–400)
RBC: 4.7 MIL/uL (ref 4.22–5.81)
RDW: 12.6 % (ref 11.5–15.5)
WBC: 5.7 K/uL (ref 4.0–10.5)
nRBC: 0 % (ref 0.0–0.2)

## 2024-07-07 LAB — URINALYSIS, ROUTINE W REFLEX MICROSCOPIC
Bilirubin Urine: NEGATIVE
Glucose, UA: NEGATIVE mg/dL
Hgb urine dipstick: NEGATIVE
Ketones, ur: NEGATIVE mg/dL
Leukocytes,Ua: NEGATIVE
Nitrite: NEGATIVE
Protein, ur: NEGATIVE mg/dL
Specific Gravity, Urine: 1.017 (ref 1.005–1.030)
pH: 6 (ref 5.0–8.0)

## 2024-07-07 MED ORDER — ACETAMINOPHEN 325 MG PO TABS
650.0000 mg | ORAL_TABLET | Freq: Once | ORAL | Status: AC
  Administered 2024-07-07: 650 mg via ORAL
  Filled 2024-07-07: qty 2

## 2024-07-07 NOTE — ED Triage Notes (Signed)
 Pt reports a syncopal event when at church last night. States he has been feeling off for a week, like he is on a boat. Pt hit back of head when he passed out. Reports pain to left side of body.

## 2024-07-07 NOTE — ED Provider Triage Note (Signed)
 Emergency Medicine Provider Triage Evaluation Note  William Mendoza , a 39 y.o. male  was evaluated in triage.  Pt complains of syncope last night at 815pm. Reports he felt like he got really hot. Unsure how long he was unconscious for. Reports he feels weird, like lightheaded. No recent illnesses. Did not urinate on himself or bite his tongue.  Review of Systems  Positive: lightheaded Negative: CP  Physical Exam  BP 119/82 (BP Location: Right Arm)   Pulse 79   Temp 97.9 F (36.6 C) (Oral)   Resp 16   Ht 6' 1 (1.854 m)   Wt 84 kg   SpO2 100%   BMI 24.43 kg/m  Gen:   Awake, no distress   Resp:  Normal effort  MSK:   Moves extremities without difficulty  Other:    Medical Decision Making  Medically screening exam initiated at 9:30 AM.  Appropriate orders placed.  William Mendoza was informed that the remainder of the evaluation will be completed by another provider, this initial triage assessment does not replace that evaluation, and the importance of remaining in the ED until their evaluation is complete.     Shrita Thien E, PA-C 07/07/24 [phone]

## 2024-07-07 NOTE — ED Provider Notes (Signed)
 "  [name] Provider Note    Event Date/Time   First MD Initiated Contact with Patient 07/07/24 1015     (approximate)   History   Loss of Consciousness   HPI  William Mendoza is a 39 y.o. male  with no significant past medical history presenting to the ED after a syncopal episode at church last night. The patient states he had been standing when he suddenly felt hot and sweaty and passed out. He is not certain how long he was out for. He reports hitting his head on the floor and reports pain to the left side of his head, face, neck, and chest at this time.        Physical Exam   Triage Vital Signs: ED Triage Vitals  Encounter Vitals Group     BP 07/07/24 0926 119/82     Girls Systolic BP Percentile --      Girls Diastolic BP Percentile --      Boys Systolic BP Percentile --      Boys Diastolic BP Percentile --      Pulse Rate 07/07/24 0926 79     Resp 07/07/24 0926 16     Temp 07/07/24 0926 97.9 F (36.6 C)     Temp Source 07/07/24 0926 Oral     SpO2 07/07/24 0926 100 %     Weight 07/07/24 0927 185 lb 3 oz (84 kg)     Height 07/07/24 0927 6' 1 (1.854 m)     Head Circumference --      Peak Flow --      Pain Score 07/07/24 0926 8     Pain Loc --      Pain Education --      Exclude from Growth Chart --     Most recent vital signs: Vitals:   07/07/24 0926  BP: 119/82  Pulse: 79  Resp: 16  Temp: 97.9 F (36.6 C)  SpO2: 100%     General: Awake, no distress.  CV:  Good peripheral perfusion.  Resp:  Normal effort.  Abd:  No distention.  Other:     ED Results / Procedures / Treatments   Labs (all labs ordered are listed, but only abnormal results are displayed) Labs Reviewed  COMPREHENSIVE METABOLIC PANEL WITH GFR  CBC  URINALYSIS, ROUTINE W REFLEX MICROSCOPIC     EKG  ED ECG REPORT I, Reche CHRISTELLA Leventhal, the attending physician, personally viewed and interpreted this ECG.  Date: 07/07/2024 Rate: 82 bpm Rhythm:  normal sinus rhythm QRS Axis: right axis deviation Intervals: normal ST/T Wave abnormalities: normal Narrative Interpretation: no evidence of acute ischemia    RADIOLOGY Head CT did not show any acute intracranial pathology    PROCEDURES:  Critical Care performed: No  Procedures   MEDICATIONS ORDERED IN ED: Medications - No data to display   IMPRESSION / MDM / ASSESSMENT AND PLAN / ED COURSE  I reviewed the triage vital signs and the nursing notes.                              Differential diagnosis includes, but is not limited to,  vasovagal syncope, cardiac arrhythmia, vertigo, intracranial hemorrhage, intoxication, medication effects, volume depletion/dehydration, metabolic disturbance, infection/sepsis, etc.   Patient's presentation is most consistent with acute illness / injury with system symptoms.  Patient is a 39 y.o. male  with no significant past medical history presenting to  the ED after a syncopal episode at church last night.  His vital signs are stable here in the emergency department.  EKG does not show any concerning findings for ischemia.  Troponin is less than 15.  Head CT does not show any acute intracranial pathology.  Lab work does not show any signs concerning for infection.  I discussed results at length with the patient.  Discussed plan for discharge with instructions to follow-up with his primary care physician within the next few days.  Drink plenty of fluids.  Return to the emergency department for any new or worsening symptoms.      FINAL CLINICAL IMPRESSION(S) / ED DIAGNOSES   Final diagnoses:  Syncope, unspecified syncope type     Rx / DC Orders   ED Discharge Orders     None        Note:  This document was prepared using Dragon voice recognition software and may include unintentional dictation errors.   Rexford Reche HERO, MD 07/07/24 1209  "

## 2024-07-07 NOTE — ED Notes (Signed)
 Lab reports adding on troponin to previous collected labs. Patient aware of needing urine specimen. Drinking water at this time.
